# Patient Record
Sex: Female | Born: 1950 | Race: White | Hispanic: No | Marital: Single | Smoking: Never smoker
Health system: Southern US, Community
[De-identification: ages and names within clinical notes are randomized; demographics above are authoritative.]

## PROBLEM LIST (undated history)

## (undated) DIAGNOSIS — M810 Age-related osteoporosis without current pathological fracture: Secondary | ICD-10-CM

## (undated) DIAGNOSIS — L0291 Cutaneous abscess, unspecified: Secondary | ICD-10-CM

## (undated) DIAGNOSIS — H9190 Unspecified hearing loss, unspecified ear: Secondary | ICD-10-CM

## (undated) DIAGNOSIS — R920 Mammographic microcalcification found on diagnostic imaging of breast: Secondary | ICD-10-CM

## (undated) DIAGNOSIS — N6019 Diffuse cystic mastopathy of unspecified breast: Secondary | ICD-10-CM

## (undated) DIAGNOSIS — Z780 Asymptomatic menopausal state: Secondary | ICD-10-CM

## (undated) DIAGNOSIS — E785 Hyperlipidemia, unspecified: Secondary | ICD-10-CM

## (undated) DIAGNOSIS — E039 Hypothyroidism, unspecified: Secondary | ICD-10-CM

## (undated) DIAGNOSIS — Z973 Presence of spectacles and contact lenses: Secondary | ICD-10-CM

## (undated) DIAGNOSIS — T8859XA Other complications of anesthesia, initial encounter: Secondary | ICD-10-CM

## (undated) DIAGNOSIS — E079 Disorder of thyroid, unspecified: Secondary | ICD-10-CM

## (undated) DIAGNOSIS — E669 Obesity, unspecified: Secondary | ICD-10-CM

## (undated) DIAGNOSIS — Z8709 Personal history of other diseases of the respiratory system: Secondary | ICD-10-CM

## (undated) DIAGNOSIS — Z8719 Personal history of other diseases of the digestive system: Secondary | ICD-10-CM

## (undated) DIAGNOSIS — G4730 Sleep apnea, unspecified: Secondary | ICD-10-CM

## (undated) DIAGNOSIS — T4145XA Adverse effect of unspecified anesthetic, initial encounter: Secondary | ICD-10-CM

## (undated) DIAGNOSIS — Z1211 Encounter for screening for malignant neoplasm of colon: Secondary | ICD-10-CM

## (undated) DIAGNOSIS — K5790 Diverticulosis of intestine, part unspecified, without perforation or abscess without bleeding: Secondary | ICD-10-CM

## (undated) DIAGNOSIS — K635 Polyp of colon: Secondary | ICD-10-CM

## (undated) DIAGNOSIS — M1990 Unspecified osteoarthritis, unspecified site: Secondary | ICD-10-CM

## (undated) HISTORY — DX: Diffuse cystic mastopathy of unspecified breast: N60.19

## (undated) HISTORY — DX: Polyp of colon: K63.5

## (undated) HISTORY — PX: CYSTOCELE REPAIR: SHX163

## (undated) HISTORY — PX: HEEL SPUR SURGERY: SHX665

## (undated) HISTORY — PX: ORIF SHOULDER FRACTURE: SHX5035

## (undated) HISTORY — DX: Encounter for screening for malignant neoplasm of colon: Z12.11

## (undated) HISTORY — DX: Disorder of thyroid, unspecified: E07.9

## (undated) HISTORY — PX: RECTOCELE REPAIR: SHX761

## (undated) HISTORY — PX: FRACTURE SURGERY: SHX138

## (undated) HISTORY — DX: Mammographic microcalcification found on diagnostic imaging of breast: R92.0

## (undated) HISTORY — DX: Personal history of other diseases of the digestive system: Z87.19

## (undated) HISTORY — PX: PLANTAR FASCIA SURGERY: SHX746

## (undated) HISTORY — DX: Hyperlipidemia, unspecified: E78.5

## (undated) HISTORY — DX: Obesity, unspecified: E66.9

## (undated) HISTORY — PX: EYE SURGERY: SHX253

## (undated) HISTORY — PX: NOSE SURGERY: SHX723

## (undated) HISTORY — DX: Asymptomatic menopausal state: Z78.0

---

## 1994-05-24 DIAGNOSIS — Z780 Asymptomatic menopausal state: Secondary | ICD-10-CM

## 1994-05-24 HISTORY — DX: Asymptomatic menopausal state: Z78.0

## 2003-05-25 HISTORY — PX: BREAST CYST ASPIRATION: SHX578

## 2004-12-02 ENCOUNTER — Other Ambulatory Visit: Payer: Self-pay

## 2004-12-07 ENCOUNTER — Ambulatory Visit: Payer: Self-pay | Admitting: Surgery

## 2005-05-10 ENCOUNTER — Ambulatory Visit: Payer: Self-pay | Admitting: Internal Medicine

## 2007-05-25 HISTORY — PX: COLONOSCOPY: SHX174

## 2007-05-25 HISTORY — PX: BIOPSY THYROID: PRO38

## 2007-11-21 ENCOUNTER — Ambulatory Visit: Payer: Self-pay | Admitting: Otolaryngology

## 2007-12-06 ENCOUNTER — Ambulatory Visit: Payer: Self-pay | Admitting: Otolaryngology

## 2008-01-02 ENCOUNTER — Ambulatory Visit: Payer: Self-pay | Admitting: Otolaryngology

## 2008-05-09 ENCOUNTER — Ambulatory Visit: Payer: Self-pay | Admitting: Unknown Physician Specialty

## 2008-09-16 ENCOUNTER — Ambulatory Visit: Payer: Self-pay | Admitting: Unknown Physician Specialty

## 2009-05-24 DIAGNOSIS — Z8719 Personal history of other diseases of the digestive system: Secondary | ICD-10-CM

## 2009-05-24 DIAGNOSIS — R920 Mammographic microcalcification found on diagnostic imaging of breast: Secondary | ICD-10-CM

## 2009-05-24 DIAGNOSIS — E079 Disorder of thyroid, unspecified: Secondary | ICD-10-CM

## 2009-05-24 HISTORY — DX: Disorder of thyroid, unspecified: E07.9

## 2009-05-24 HISTORY — DX: Personal history of other diseases of the digestive system: Z87.19

## 2009-05-24 HISTORY — DX: Mammographic microcalcification found on diagnostic imaging of breast: R92.0

## 2009-11-05 ENCOUNTER — Emergency Department: Payer: Self-pay | Admitting: Emergency Medicine

## 2009-11-24 ENCOUNTER — Ambulatory Visit: Payer: Self-pay | Admitting: Family Medicine

## 2010-02-21 HISTORY — PX: BREAST SURGERY: SHX581

## 2010-05-24 DIAGNOSIS — E785 Hyperlipidemia, unspecified: Secondary | ICD-10-CM

## 2010-05-24 HISTORY — DX: Hyperlipidemia, unspecified: E78.5

## 2010-08-26 ENCOUNTER — Ambulatory Visit: Payer: Self-pay | Admitting: General Surgery

## 2011-02-26 ENCOUNTER — Ambulatory Visit: Payer: Self-pay | Admitting: General Surgery

## 2011-04-07 ENCOUNTER — Ambulatory Visit: Payer: Self-pay | Admitting: Orthopedic Surgery

## 2011-05-25 DIAGNOSIS — N6019 Diffuse cystic mastopathy of unspecified breast: Secondary | ICD-10-CM

## 2011-05-25 HISTORY — DX: Diffuse cystic mastopathy of unspecified breast: N60.19

## 2012-02-28 ENCOUNTER — Ambulatory Visit: Payer: Self-pay | Admitting: General Surgery

## 2012-03-03 ENCOUNTER — Other Ambulatory Visit: Payer: Self-pay | Admitting: Orthopedic Surgery

## 2012-03-06 ENCOUNTER — Encounter (HOSPITAL_BASED_OUTPATIENT_CLINIC_OR_DEPARTMENT_OTHER): Payer: Self-pay | Admitting: *Deleted

## 2012-03-06 NOTE — Progress Notes (Signed)
Denies any heart or resp problems  

## 2012-03-08 NOTE — H&P (Signed)
Holly Carroll is an 61 y.o. female.   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   HPI: Holly Carroll presents for evaluation of a number of predicaments affecting her right hand. I saw Holly Carroll for a detailed evaluation in June of 2011. At that time we noted that she likely had bilateral carpal tunnel syndrome. 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 We did not recommend proceeding with treatment of the Dupuytren's contracture at that time.   Holly Carroll now has hand numbness, discomfort at night, and chronic triggering of her right ring finger. She is developing early Dupuytren's palmar fibromatosis of her right palm pretendinous fibers of the ring finger. Her left Dupuytren's fibromatosis progressed with nodular disease in the palm to the ring finger.     Past Medical History  Diagnosis Date  . Complication of anesthesia     hard to wake up,memory loss  . Arthritis   . Wears glasses   . Sleep apnea     uses a cpap-will bring dos-will use post op    Past Surgical History  Procedure Date  . Cystocele repair   . Rectocele repair   . Heel spur surgery     both feet  . Plantar fascia surgery     right  . Nose surgery     growth on nose as child  . Orif shoulder fracture     No family history on file. Social History:  reports that she has never smoked. She does not have any smokeless tobacco history on file. She reports that she does not drink alcohol or use illicit drugs.  Allergies:  Allergies  Allergen Reactions  . Doxycycline     Losses balance  . Tape     blister    No prescriptions prior to admission    No results found for this or any previous visit (from the past 48 hour(s)).  No results found.   Pertinent items are noted in  HPI.  Height 5\' 6"  (1.676 m), weight 96.163 kg (212 lb).  General appearance: alert Head: Normocephalic, without obvious abnormality Neck: supple, symmetrical, trachea midline Resp: clear to auscultation bilaterally Cardio: RRR without murmur GI: normal findings: bowel sounds normal Extremities. She has a very stiff right ring finger. She is tender on palpation over the A-1 pulley and has marked thickening of her flexor sheath. She has early palmar fibromatosis nodules in the pretendinous fibers of the right ring finger. She has full motion of the MP joint. She can extend her PIP joint to a 5 degree flexion contracture and has further flexion to 80 degrees. She cannot close her fingers to the palm. She has full passive motion of the index, long and small fingers. Her neurovascular exam is intact.   Plain x-rays are obtained to rule out possible arthritis at the right ring finger PIP joint as a cause of her inability to close her fingers to the palm. She does have diffuse narrowing of all her IP joints but does not show a preponderance of arthritis at the right ring finger PIP joint.   Pulses: 2+ and symmetric Skin: normal Neurologic: Grossly normal    Assessment/Plan Impression:1) Chronic STS right ring finger  2) Early Dupuytren's cord right ring finger  3) right CTS  Plan: To the OR for release  A-1 pulley right ring finger with resection pretedinous fibers and possible Right CTR.The procedure, risks,benefits and post-op course were discussed with the patient at length and they were in agreement with the plan.   DASNOIT,Holly Carroll 03/08/2012, 3:05 PM     H&P documentation: 03/09/2012  -History and Physical Reviewed  -Patient has been re-examined  -No change in the plan of care  Wyn Forster, MD

## 2012-03-09 ENCOUNTER — Encounter (HOSPITAL_BASED_OUTPATIENT_CLINIC_OR_DEPARTMENT_OTHER): Payer: Self-pay | Admitting: Anesthesiology

## 2012-03-09 ENCOUNTER — Encounter (HOSPITAL_BASED_OUTPATIENT_CLINIC_OR_DEPARTMENT_OTHER): Payer: Self-pay | Admitting: *Deleted

## 2012-03-09 ENCOUNTER — Encounter (HOSPITAL_BASED_OUTPATIENT_CLINIC_OR_DEPARTMENT_OTHER)
Admission: RE | Disposition: A | Discharge status: Home / Self Care | Payer: Self-pay | Source: Ambulatory Visit | Attending: Orthopedic Surgery

## 2012-03-09 ENCOUNTER — Ambulatory Visit (HOSPITAL_BASED_OUTPATIENT_CLINIC_OR_DEPARTMENT_OTHER)
Admission: RE | Admit: 2012-03-09 | Discharge: 2012-03-09 | Disposition: A | Discharge status: Home / Self Care | Payer: BC Managed Care – PPO | Source: Ambulatory Visit | Attending: Orthopedic Surgery | Admitting: Orthopedic Surgery

## 2012-03-09 ENCOUNTER — Encounter (HOSPITAL_BASED_OUTPATIENT_CLINIC_OR_DEPARTMENT_OTHER): Payer: Self-pay | Admitting: Certified Registered"

## 2012-03-09 ENCOUNTER — Ambulatory Visit (HOSPITAL_BASED_OUTPATIENT_CLINIC_OR_DEPARTMENT_OTHER): Payer: BC Managed Care – PPO | Admitting: Anesthesiology

## 2012-03-09 DIAGNOSIS — M65839 Other synovitis and tenosynovitis, unspecified forearm: Secondary | ICD-10-CM | POA: Insufficient documentation

## 2012-03-09 DIAGNOSIS — M65849 Other synovitis and tenosynovitis, unspecified hand: Secondary | ICD-10-CM | POA: Insufficient documentation

## 2012-03-09 DIAGNOSIS — G5600 Carpal tunnel syndrome, unspecified upper limb: Secondary | ICD-10-CM | POA: Insufficient documentation

## 2012-03-09 DIAGNOSIS — M6530 Trigger finger, unspecified finger: Secondary | ICD-10-CM | POA: Insufficient documentation

## 2012-03-09 DIAGNOSIS — M720 Palmar fascial fibromatosis [Dupuytren]: Secondary | ICD-10-CM | POA: Insufficient documentation

## 2012-03-09 HISTORY — DX: Other complications of anesthesia, initial encounter: T88.59XA

## 2012-03-09 HISTORY — PX: CARPAL TUNNEL RELEASE: SHX101

## 2012-03-09 HISTORY — PX: DUPUYTREN CONTRACTURE RELEASE: SHX1478

## 2012-03-09 HISTORY — DX: Unspecified osteoarthritis, unspecified site: M19.90

## 2012-03-09 HISTORY — DX: Sleep apnea, unspecified: G47.30

## 2012-03-09 HISTORY — PX: TRIGGER FINGER RELEASE: SHX641

## 2012-03-09 HISTORY — DX: Adverse effect of unspecified anesthetic, initial encounter: T41.45XA

## 2012-03-09 HISTORY — DX: Presence of spectacles and contact lenses: Z97.3

## 2012-03-09 LAB — POCT HEMOGLOBIN-HEMACUE: Hemoglobin: 13.5 g/dL (ref 12.0–15.0)

## 2012-03-09 SURGERY — RELEASE, A1 PULLEY, FOR TRIGGER FINGER
Anesthesia: General | Site: Hand | Laterality: Right | Wound class: Clean

## 2012-03-09 MED ORDER — DEXAMETHASONE SODIUM PHOSPHATE 4 MG/ML IJ SOLN
INTRAMUSCULAR | Status: DC | PRN
  Administered 2012-03-09: 8 mg via INTRAVENOUS

## 2012-03-09 MED ORDER — PROPOFOL 10 MG/ML IV BOLUS
INTRAVENOUS | Status: DC | PRN
  Administered 2012-03-09: 50 mg via INTRAVENOUS
  Administered 2012-03-09: 150 mg via INTRAVENOUS

## 2012-03-09 MED ORDER — FENTANYL CITRATE 0.05 MG/ML IJ SOLN
50.0000 ug | INTRAMUSCULAR | Status: AC | PRN
  Administered 2012-03-09 (×2): 50 ug via INTRAVENOUS

## 2012-03-09 MED ORDER — LACTATED RINGERS IV SOLN
INTRAVENOUS | Status: DC
  Administered 2012-03-09: 08:00:00 via INTRAVENOUS

## 2012-03-09 MED ORDER — ROPIVACAINE HCL 5 MG/ML IJ SOLN
INTRAMUSCULAR | Status: DC | PRN
  Administered 2012-03-09: 150 mg via EPIDURAL

## 2012-03-09 MED ORDER — CHLORHEXIDINE GLUCONATE 4 % EX LIQD
60.0000 mL | Freq: Once | CUTANEOUS | Status: AC
  Administered 2012-03-09: 4 via TOPICAL

## 2012-03-09 MED ORDER — OXYCODONE-ACETAMINOPHEN 5-325 MG PO TABS: 1.0000 | ORAL_TABLET | ORAL | Status: DC | PRN

## 2012-03-09 MED ORDER — MIDAZOLAM HCL 2 MG/2ML IJ SOLN
1.0000 mg | INTRAMUSCULAR | Status: AC | PRN
  Administered 2012-03-09 (×2): 1 mg via INTRAVENOUS

## 2012-03-09 SURGICAL SUPPLY — 57 items
BANDAGE ADHESIVE 1X3 (GAUZE/BANDAGES/DRESSINGS) IMPLANT
BANDAGE CONFORM 3  STR LF (GAUZE/BANDAGES/DRESSINGS) IMPLANT
BANDAGE ELASTIC 3 VELCRO ST LF (GAUZE/BANDAGES/DRESSINGS) ×3 IMPLANT
BANDAGE GAUZE ELAST BULKY 4 IN (GAUZE/BANDAGES/DRESSINGS) IMPLANT
BLADE MINI RND TIP GREEN BEAV (BLADE) ×3 IMPLANT
BLADE SURG 15 STRL LF DISP TIS (BLADE) ×2 IMPLANT
BLADE SURG 15 STRL SS (BLADE) ×1
BNDG ELASTIC 2 VLCR STRL LF (GAUZE/BANDAGES/DRESSINGS) ×3 IMPLANT
BNDG ESMARK 4X9 LF (GAUZE/BANDAGES/DRESSINGS) ×3 IMPLANT
BRUSH SCRUB EZ PLAIN DRY (MISCELLANEOUS) ×3 IMPLANT
CLOTH BEACON ORANGE TIMEOUT ST (SAFETY) ×3 IMPLANT
CORDS BIPOLAR (ELECTRODE) ×3 IMPLANT
COVER MAYO STAND STRL (DRAPES) ×3 IMPLANT
COVER TABLE BACK 60X90 (DRAPES) ×3 IMPLANT
CUFF TOURNIQUET SINGLE 18IN (TOURNIQUET CUFF) ×3 IMPLANT
DECANTER SPIKE VIAL GLASS SM (MISCELLANEOUS) IMPLANT
DRAPE EXTREMITY T 121X128X90 (DRAPE) ×3 IMPLANT
DRAPE SURG 17X23 STRL (DRAPES) ×3 IMPLANT
DRSG EMULSION OIL 3X3 NADH (GAUZE/BANDAGES/DRESSINGS) IMPLANT
GAUZE SPONGE 4X4 12PLY STRL LF (GAUZE/BANDAGES/DRESSINGS) ×6 IMPLANT
GAUZE XEROFORM 1X8 LF (GAUZE/BANDAGES/DRESSINGS) ×3 IMPLANT
GLOVE BIO SURGEON STRL SZ 6.5 (GLOVE) ×3 IMPLANT
GLOVE BIOGEL M STRL SZ7.5 (GLOVE) ×3 IMPLANT
GLOVE BIOGEL PI IND STRL 7.0 (GLOVE) ×2 IMPLANT
GLOVE BIOGEL PI INDICATOR 7.0 (GLOVE) ×1
GLOVE ORTHO TXT STRL SZ7.5 (GLOVE) ×3 IMPLANT
GOWN PREVENTION PLUS XLARGE (GOWN DISPOSABLE) ×3 IMPLANT
GOWN PREVENTION PLUS XXLARGE (GOWN DISPOSABLE) ×6 IMPLANT
GOWN STRL REIN XL XLG (GOWN DISPOSABLE) ×6 IMPLANT
LOOP VESSEL MAXI BLUE (MISCELLANEOUS) ×3 IMPLANT
NEEDLE 27GAX1X1/2 (NEEDLE) ×3 IMPLANT
NEEDLE HYPO 25X1 1.5 SAFETY (NEEDLE) IMPLANT
NS IRRIG 1000ML POUR BTL (IV SOLUTION) ×3 IMPLANT
PACK BASIN DAY SURGERY FS (CUSTOM PROCEDURE TRAY) ×3 IMPLANT
PAD CAST 3X4 CTTN HI CHSV (CAST SUPPLIES) ×2 IMPLANT
PAD CAST 4YDX4 CTTN HI CHSV (CAST SUPPLIES) ×2 IMPLANT
PADDING CAST ABS 4INX4YD NS (CAST SUPPLIES) ×1
PADDING CAST ABS COTTON 4X4 ST (CAST SUPPLIES) ×2 IMPLANT
PADDING CAST COTTON 3X4 STRL (CAST SUPPLIES) ×1
PADDING CAST COTTON 4X4 STRL (CAST SUPPLIES) ×1
SPLINT PLASTER CAST XFAST 3X15 (CAST SUPPLIES) ×10 IMPLANT
SPLINT PLASTER XTRA FASTSET 3X (CAST SUPPLIES) ×5
SPONGE GAUZE 4X4 12PLY (GAUZE/BANDAGES/DRESSINGS) ×3 IMPLANT
STOCKINETTE 4X48 STRL (DRAPES) ×3 IMPLANT
STRIP CLOSURE SKIN 1/2X4 (GAUZE/BANDAGES/DRESSINGS) ×3 IMPLANT
SUT ETHILON 5 0 P 3 18 (SUTURE) ×1
SUT NYLON ETHILON 5-0 P-3 1X18 (SUTURE) ×2 IMPLANT
SUT PROLENE 3 0 PS 2 (SUTURE) ×3 IMPLANT
SUT SILK 4 0 PS 2 (SUTURE) IMPLANT
SUT VIC AB 4-0 P2 18 (SUTURE) IMPLANT
SYR 3ML 23GX1 SAFETY (SYRINGE) IMPLANT
SYR BULB 3OZ (MISCELLANEOUS) IMPLANT
SYR CONTROL 10ML LL (SYRINGE) ×3 IMPLANT
TOWEL OR 17X24 6PK STRL BLUE (TOWEL DISPOSABLE) ×6 IMPLANT
TRAY DSU PREP LF (CUSTOM PROCEDURE TRAY) ×3 IMPLANT
UNDERPAD 30X30 INCONTINENT (UNDERPADS AND DIAPERS) ×3 IMPLANT
WATER STERILE IRR 1000ML POUR (IV SOLUTION) ×3 IMPLANT

## 2012-03-09 NOTE — Progress Notes (Signed)
Assisted Dr. Singer with right, ultrasound guided, supraclavicular block. Side rails up, monitors on throughout procedure. See vital signs in flow sheet. Tolerated Procedure well. 

## 2012-03-09 NOTE — Anesthesia Procedure Notes (Addendum)
Anesthesia Regional Block:  Supraclavicular block  Pre-Anesthetic Checklist: ,, timeout performed, Correct Patient, Correct Site, Correct Laterality, Correct Procedure,, site marked, risks and benefits discussed, Surgical consent,  Pre-op evaluation,  At surgeon's request and post-op pain management  Laterality: Right  Prep: chloraprep       Needles:  Injection technique: Single-shot  Needle Type: Echogenic Stimulator Needle     Needle Length: 5cm 5 cm Needle Gauge: 22 and 22 G    Additional Needles:  Procedures: ultrasound guided and nerve stimulator Supraclavicular block  Nerve Stimulator or Paresthesia:  Response: bicep contraction, 0.45 mA,   Additional Responses:   Narrative:  Start time: 03/09/2012 8:33 AM End time: 03/09/2012 8:46 AM Injection made incrementally with aspirations every 5 mL.  Performed by: Personally  Anesthesiologist: J. Adonis Huguenin, MD  Additional Notes: Functioning IV was confirmed and monitors applied.  A 50mm 22ga echogenic arrow stimulator was used. Sterile prep and drape,hand hygiene and sterile gloves were used.Ultrasound guidance: relevant anatomy identified, needle position confirmed, local anesthetic spread visualized around nerve(s)., vascular puncture avoided.  Image printed for medical record.  Negative aspiration and negative test dose prior to incremental administration of local anesthetic. The patient tolerated the procedure well.  Interscalene brachial plexus block Procedure Name: LMA Insertion Date/Time: 03/09/2012 9:44 AM Performed by: Verlan Friends Pre-anesthesia Checklist: Patient identified, Emergency Drugs available, Suction available, Patient being monitored and Timeout performed Patient Re-evaluated:Patient Re-evaluated prior to inductionOxygen Delivery Method: Circle System Utilized Preoxygenation: Pre-oxygenation with 100% oxygen Intubation Type: IV induction Ventilation: Mask ventilation without difficulty LMA:  LMA inserted LMA Size: 4.0 Number of attempts: 1 Airway Equipment and Method: bite block Placement Confirmation: positive ETCO2 Tube secured with: Tape (paper tape used) Dental Injury: Teeth and Oropharynx as per pre-operative assessment

## 2012-03-09 NOTE — Transfer of Care (Signed)
Immediate Anesthesia Transfer of Care Note  Patient: Holly Carroll  Procedure(s) Performed: Procedure(s) (LRB) with comments: RELEASE TRIGGER FINGER/A-1 PULLEY (Right) - a-1 pulley release right ring  DUPUYTREN CONTRACTURE RELEASE (Right) CARPAL TUNNEL RELEASE (Right)  Patient Location: PACU  Anesthesia Type: GA combined with regional for post-op pain  Level of Consciousness: sedated and patient cooperative  Airway & Oxygen Therapy: Patient Spontanous Breathing and Patient connected to face mask oxygen  Post-op Assessment: Report given to PACU RN and Post -op Vital signs reviewed and stable  Post vital signs: Reviewed and stable  Complications: No apparent anesthesia complications

## 2012-03-09 NOTE — Brief Op Note (Signed)
03/09/2012  10:12 AM  PATIENT:  Holly Carroll  61 y.o. female  PRE-OPERATIVE DIAGNOSIS:  trigger finger right ring, carpal tunnel syndrome and dupuytrens right   POST-OPERATIVE DIAGNOSIS:  trigger finger right ring, carpal tunnel syndrome and dupuytrens right  PROCEDURE:  Procedure(s) (LRB) with comments: RELEASE TRIGGER FINGER/A-1 PULLEY (Right) - a-1 pulley release right ring  DUPUYTREN CONTRACTURE RELEASE (Right) CARPAL TUNNEL RELEASE (Right)  SURGEON:  Surgeon(s) and Role:    * Wyn Forster., MD - Primary  PHYSICIAN ASSISTANT:   ASSISTANTS:Dakari Cregger Dasnoit,P.A-C   ANESTHESIA:   general  EBL:  Total I/O In: 400 [I.V.:400] Out: -   BLOOD ADMINISTERED:none  DRAINS: none   LOCAL MEDICATIONS USED:ropivacaine  SPECIMEN:  No Specimen  DISPOSITION OF SPECIMEN:  N/A  COUNTS:  YES  TOURNIQUET:   Total Tourniquet Time Documented: Upper Arm (Right) - 19 minutes  DICTATION: .Other Dictation: Dictation Number [id number]  PLAN OF CARE: Discharge to home after PACU  PATIENT DISPOSITION:  PACU - hemodynamically stable.

## 2012-03-09 NOTE — Op Note (Signed)
[id number] 

## 2012-03-09 NOTE — Anesthesia Preprocedure Evaluation (Signed)
Anesthesia Evaluation  Patient identified by MRN, date of birth, ID band Patient awake    Reviewed: Allergy & Precautions, H&P , NPO status , Patient's Chart, lab work & pertinent test results  History of Anesthesia Complications (+) PROLONGED EMERGENCE  Airway Mallampati: II TM Distance: >3 FB Neck ROM: Full    Dental  (+) Teeth Intact, Caps and Dental Advisory Given   Pulmonary sleep apnea ,    Pulmonary exam normal       Cardiovascular     Neuro/Psych negative neurological ROS     GI/Hepatic negative GI ROS, Neg liver ROS,   Endo/Other  Morbid obesity  Renal/GU negative Renal ROS     Musculoskeletal   Abdominal   Peds  Hematology   Anesthesia Other Findings   Reproductive/Obstetrics                           Anesthesia Physical Anesthesia Plan  ASA: III  Anesthesia Plan: General   Post-op Pain Management:    Induction: Intravenous  Airway Management Planned: LMA  Additional Equipment:   Intra-op Plan:   Post-operative Plan: Extubation in OR  Informed Consent: I have reviewed the patients History and Physical, chart, labs and discussed the procedure including the risks, benefits and alternatives for the proposed anesthesia with the patient or authorized representative who has indicated his/her understanding and acceptance.   Dental advisory given  Plan Discussed with: CRNA, Anesthesiologist and Surgeon  Anesthesia Plan Comments:         Anesthesia Quick Evaluation

## 2012-03-09 NOTE — Anesthesia Postprocedure Evaluation (Signed)
Anesthesia Post Note  Patient: Holly Carroll  Procedure(s) Performed: Procedure(s) (LRB): RELEASE TRIGGER FINGER/A-1 PULLEY (Right) DUPUYTREN CONTRACTURE RELEASE (Right) CARPAL TUNNEL RELEASE (Right)  Anesthesia type: general  Patient location: PACU  Post pain: Pain level controlled  Post assessment: Patient's Cardiovascular Status Stable  Last Vitals:  Filed Vitals:   03/09/12 1135  BP: 122/70  Pulse: 64  Temp: 36.7 C  Resp: 14    Post vital signs: Reviewed and stable  Level of consciousness: sedated  Complications: No apparent anesthesia complications

## 2012-03-10 ENCOUNTER — Encounter (HOSPITAL_BASED_OUTPATIENT_CLINIC_OR_DEPARTMENT_OTHER): Payer: Self-pay | Admitting: Orthopedic Surgery

## 2012-03-10 NOTE — Op Note (Signed)
[name], Carroll                ACCOUNT NO.:  [id number]  MEDICAL RECORD NO.:  [id number]  LOCATION:                                 FACILITY:  PHYSICIAN:  Katy Fitch. Keyanah Kozicki, M.D. 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  OPERATION: 1. Release of right ring finger A1 pulley. 2. Resection of Dupuytren's palmar fibromatosis, pretendinous fibers,     right ring finger and palm. 3. Through separate incision, release of transverse carpal ligament to     decompress the median nerve.  OPERATING SURGEON:  Katy Fitch. Lanaiya Lantry, MD  ASSISTANT:  Marveen Reeks Dasnoit, PA-C  ANESTHESIA:  General by LMA.  SUPERVISING ANESTHESIOLOGIST:  Quita Skye. Krista Blue, M.D.  INDICATIONS:  Holly Carroll is a 61 year old woman employed by [employer] in [place], [place].  She has a history of multiple right hand symptoms including triggering of her right ring finger and stiffness of her right ring finger PIP joint, early Dupuytren's palmar fibromatosis and chronic hand numbness.  She was evaluated at [facility] with electrodiagnostic studies performed in December 2012, which revealed  right carpal tunnel syndrome.  Due to a failure to respond to nonoperative measures, she returned for hand surgery consult requesting correction of her trigger finger, Dupuytren's and carpal tunnel release.  After informed consent, she is brought to the operating room at this time.  PROCEDURE:  Candence Sease was brought to room #1 of the [facility] and placed supine position on the operating table.  Following the induction of general anesthesia by LMA technique, the right hand and arm were prepped with Betadine soap and solution, and sterilely draped.  Preoperatively, Dr. Krista Blue placed a plexus block for perioperative comfort with ropivacaine.  Excellent anesthesia of the right arm and forequarter was achieved.  Following routine Betadine scrub and paint to the right upper extremity, a pneumatic tourniquet was applied to the proximal right brachium. Following exsanguination of right arm with an Esmarch bandage, the arterial tourniquet was inflated to 220 mmHg.  Procedure commenced with a routine surgical time-out.  Brunner zigzag incisions were fashioned to expose the pretendinous fibers to the right ring finger in the palm and the A1 pulley of the right ring finger.  The skin incisions were taken sharply followed by use of a scalpel and scissors to identify the palmar fascia.  The entire pretendinous fiber bundle of the ring finger was removed as well as the expansion of the palmar fascia to the long and small fingers.  The A1 pulley  was isolated and noted to have some myxoid degenerative change.  The myxoid changes were cleared with a rongeur followed by release of the A1 pulley.  A synovectomy of the superficialis and profundus tendons of the ring finger was accomplished followed by identification of full passive range of motion of the finger.  The Bruner incision was then repaired with intradermal 3-0 Prolene suture.  Attention was then directed to the proximal palm.   A short incision was fashioned in line of the ring finger followed by careful division of subcutaneous tissues.  The palmar fascia was identified and split longitudinally.  The distal margin of the transverse carpal ligament was identified followed by sounding of the carpal canal with a Insurance risk surveyor.  The common sensory branch of the median nerve were followed back to the median nerve proper.  This was isolated from the transverse carpal ligament with the aid of a Insurance risk surveyor.  The transverse carpal ligament was then released with scissors extending into the distal forearm, widely opened the carpal canal.  The ulnar bursa was quite fibrotic.  The median nerve was invested in fibrotic tenosynovium.  No masses or other predicaments were noted.  Bleeding points along the margin of the released ligament were electrocauterized with bipolar current followed by repair of the skin with intradermal 3-0 Prolene suture.  A compressive dressing was applied with a volar plaster splint maintaining the wrist in 15 degrees of dorsiflexion.  For aftercare, Ms. Lyday is provided prescription for Percocet 5 mg 1 or 2 tablets p.o. q.4-6 hours p.r.n. pain, 20 tablets without refill.     Katy Fitch Neshawn Aird, M.D.     RVS/MEDQ  D:  03/09/2012  T:  03/10/2012  Job:  [id number]

## 2012-10-30 ENCOUNTER — Encounter: Payer: Self-pay | Admitting: *Deleted

## 2013-02-18 IMAGING — CR PELVIS - 1-2 VIEW
1 series · 1 of 1 positions shown · non-contrast
Comparison: none

REASON FOR EXAM: low back pain left hip pain
COMMENTS:

[view not recorded]
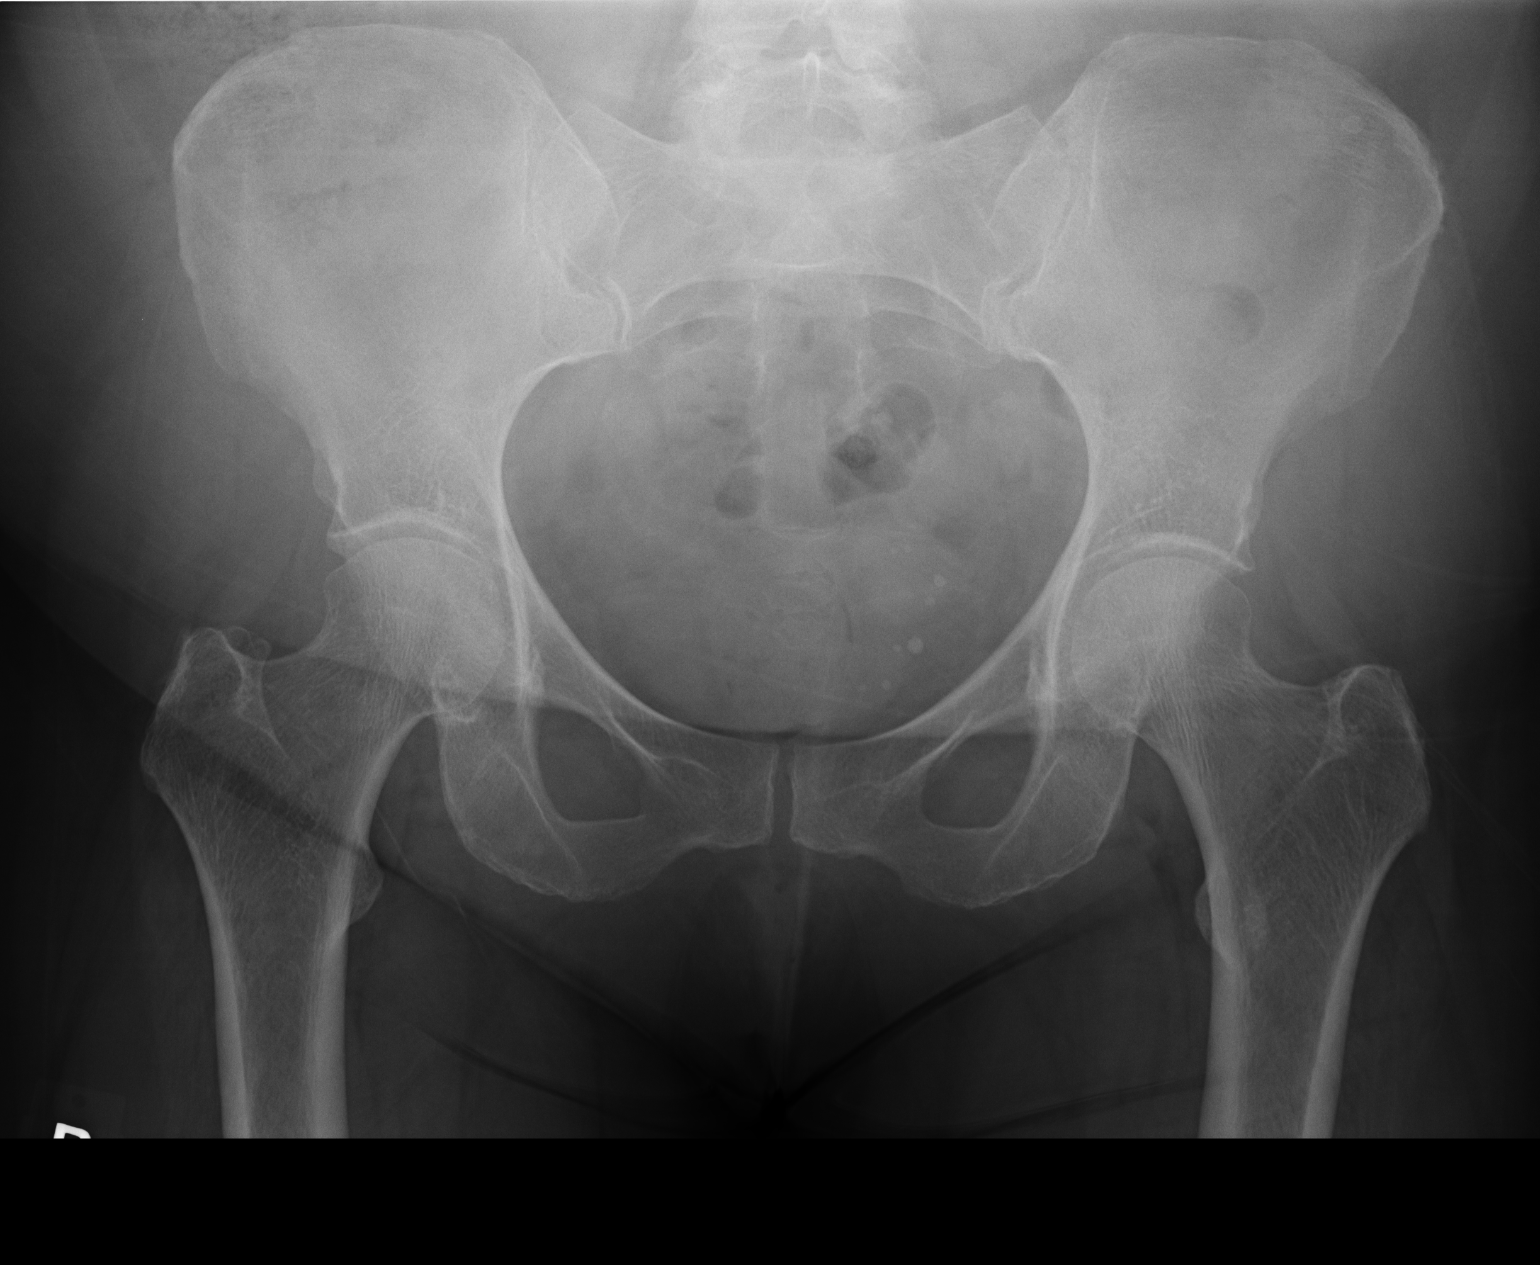

[1 of 1 positions shown; findings below may reference images not displayed]

PROCEDURE:     KDR - KDXR PELVIS AP ONLY  - April 07, 2011  [DATE]

RESULT:     An AP view of the bony pelvis shows no fracture or other acute
bony abnormality. The hip joint spaces are well-maintained and are
symmetrical. No lytic or blastic lesions are seen. The sacroiliac joints are
normal in appearance.
IMPRESSION: 1.     No significant abnormalities are noted.

## 2013-02-18 IMAGING — CR DG LUMBAR SPINE 2-3V
1 series · 3 of 3 positions shown · non-contrast
Comparison: none

REASON FOR EXAM: low back pain left hip pain
COMMENTS:

PROCEDURE:     KDR - KDXR LUMBAR SPINE AP AND LATERAL  - April 07, 2011  [DATE]
RESULT:     The vertebral body heights and the intervertebral disc spaces
are well maintained. The vertebral body alignment is normal. The pedicles
are bilaterally intact. No lytic or blastic lesions are seen.

[Series 1: view not recorded · 0.17mm/px · 3 of 3 slices shown]
[im 1/3]
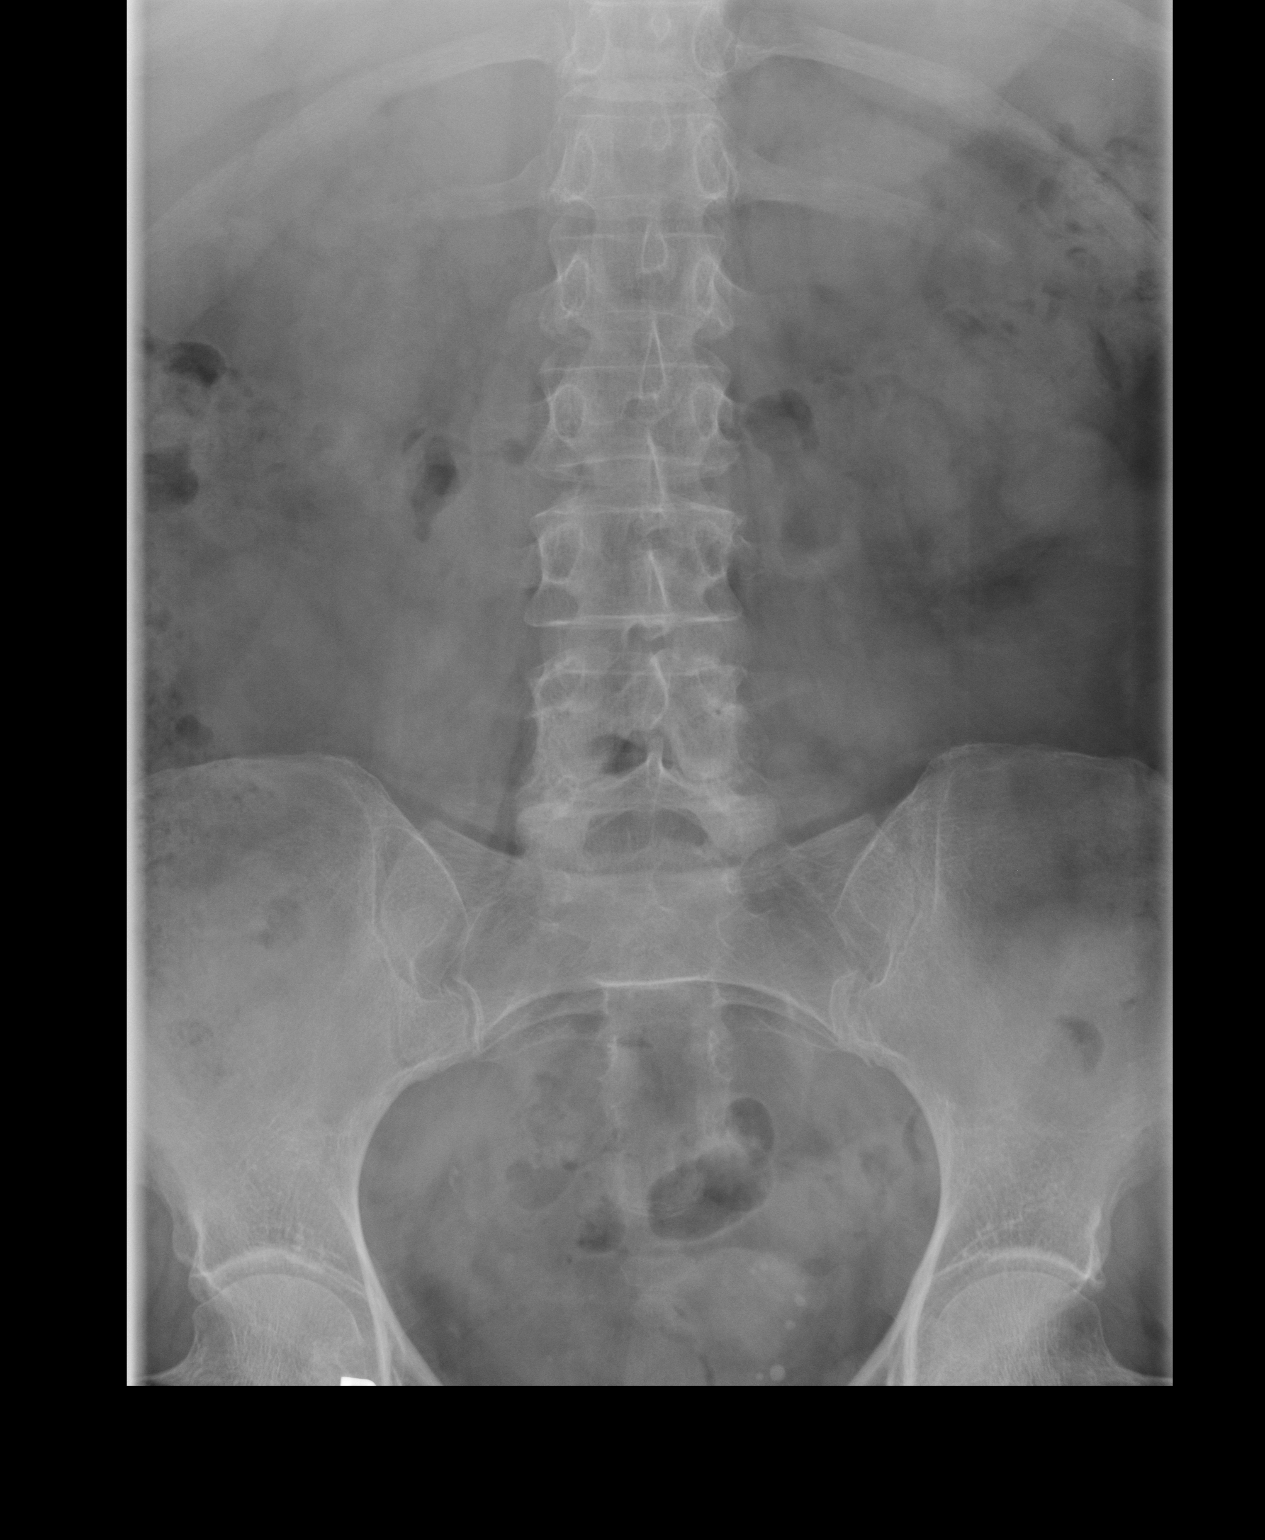
[im 2/3]
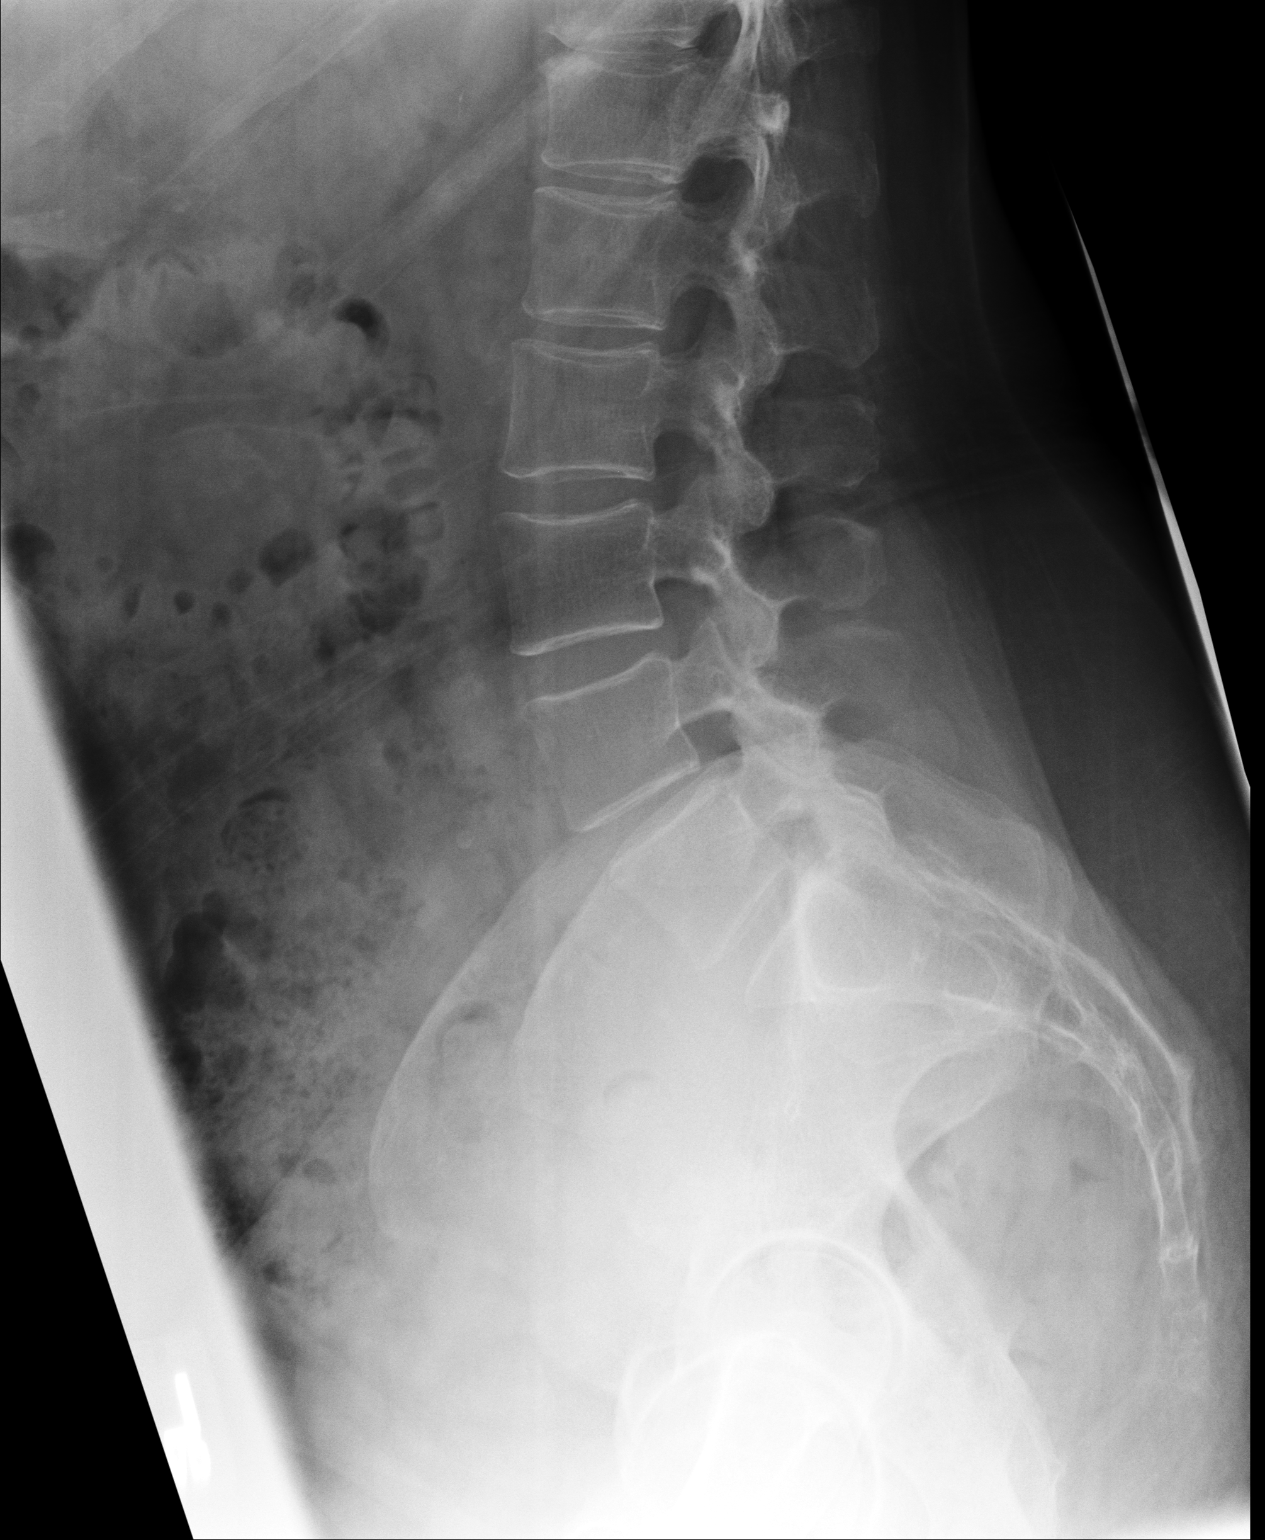
[im 3/3]
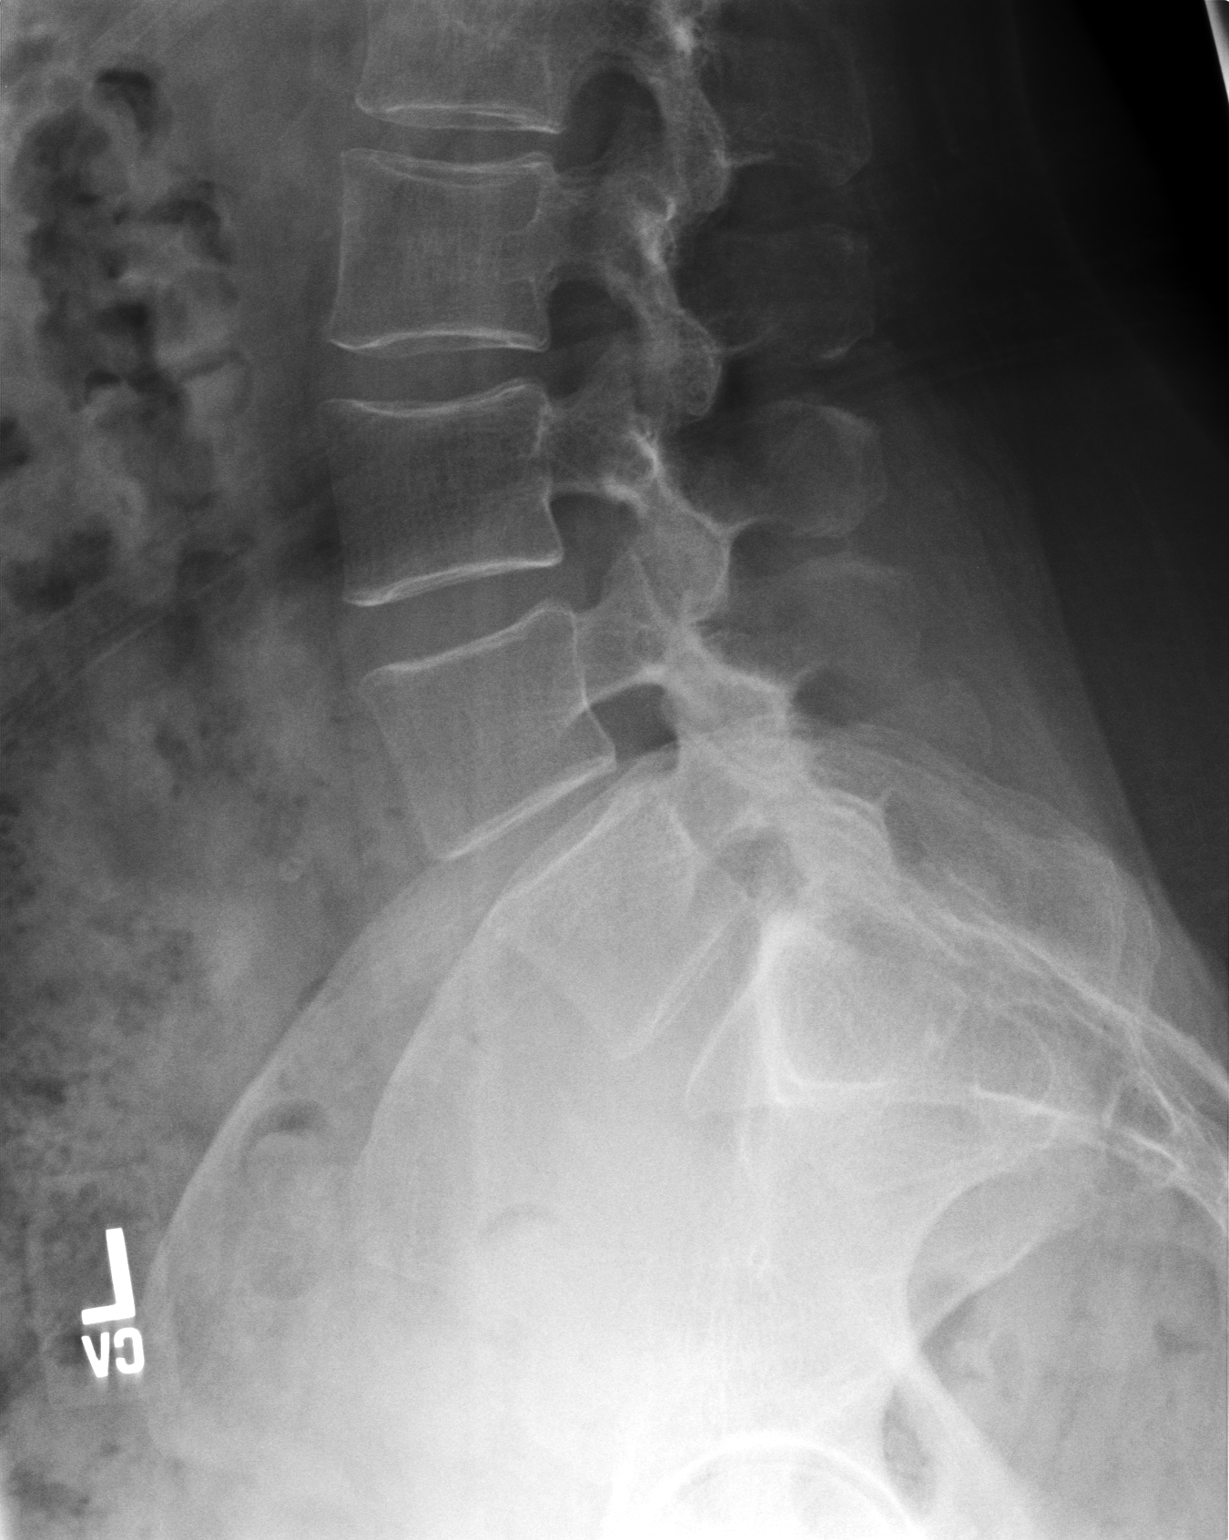

[3 of 3 positions shown; findings below may reference images not displayed]

IMPRESSION: 1.     No significant abnormalities are noted.

## 2013-03-06 ENCOUNTER — Ambulatory Visit: Payer: Self-pay

## 2013-03-07 ENCOUNTER — Ambulatory Visit: Payer: Self-pay | Admitting: General Surgery

## 2013-03-07 ENCOUNTER — Encounter: Payer: Self-pay | Admitting: General Surgery

## 2013-03-13 ENCOUNTER — Ambulatory Visit: Payer: Self-pay | Admitting: General Surgery

## 2013-03-15 ENCOUNTER — Ambulatory Visit (INDEPENDENT_AMBULATORY_CARE_PROVIDER_SITE_OTHER): Payer: BC Managed Care – PPO | Admitting: General Surgery

## 2013-03-15 ENCOUNTER — Ambulatory Visit: Payer: Self-pay

## 2013-03-15 ENCOUNTER — Encounter: Payer: Self-pay | Admitting: General Surgery

## 2013-03-15 VITALS — BP 120/70 | HR 80 | Resp 12 | Ht 66.0 in | Wt 209.0 lb

## 2013-03-15 DIAGNOSIS — Z1239 Encounter for other screening for malignant neoplasm of breast: Secondary | ICD-10-CM

## 2013-03-15 DIAGNOSIS — N6019 Diffuse cystic mastopathy of unspecified breast: Secondary | ICD-10-CM

## 2013-03-15 NOTE — Patient Instructions (Signed)
Patient to continue to do monthly self breast checks.

## 2013-03-15 NOTE — Progress Notes (Signed)
Patient ID: Holly Carroll, female   DOB: 01-13-1951, 62 y.o.   MRN: [id number]  Chief Complaint  Patient presents with  . Follow-up    mammogram    HPI Holly Carroll is a 62 y.o. female who presents for a breast evaluation. The most recent mammogram was done on 02/28/13. Patient does perform regular self breast checks and gets regular mammograms done.    HPI   Past Medical History  Diagnosis Date  . Complication of anesthesia     hard to wake up,memory loss  . Arthritis   . Wears glasses   . Sleep apnea     uses a cpap-will bring dos-will use post op  . Menopause 1996  . Diffuse cystic mastopathy 2013  . Thyroid disease 2011  . Mammographic microcalcification 2011  . Special screening for malignant neoplasms, colon   . Hyperlipidemia 2012  . Obesity, unspecified   . History of IBS 2011    Past Surgical History  Procedure Laterality Date  . Cystocele repair    . Rectocele repair    . Heel spur surgery      both feet  . Plantar fascia surgery      right  . Nose surgery      growth on nose as child  . Orif shoulder fracture    . Trigger finger release  03/09/2012    Procedure: RELEASE TRIGGER FINGER/A-1 PULLEY;  Surgeon: Wyn Forster., MD;  Location: [facility];  Service: Orthopedics;  Laterality: Right;  a-1 pulley release right ring   . Dupuytren contracture release  03/09/2012    Procedure: DUPUYTREN CONTRACTURE RELEASE;  Surgeon: Wyn Forster., MD;  Location: [facility];  Service: Orthopedics;  Laterality: Right;  . Carpal tunnel release  03/09/2012    Procedure: CARPAL TUNNEL RELEASE;  Surgeon: Wyn Forster., MD;  Location: [facility];  Service: Orthopedics;  Laterality: Right;  . Breast surgery Right October 2011    stereo bx  . Colonoscopy  2009    Dr. Mechele Collin  . Breast cyst aspiration Right 2005  . Biopsy thyroid  2009    nodule    Family History  Problem Relation Age of Onset  . Breast cancer  Maternal Grandmother   . Cervical cancer Mother     Social History History  Substance Use Topics  . Smoking status: Never Smoker   . Smokeless tobacco: Never Used  . Alcohol Use: No    Allergies  Allergen Reactions  . Doxycycline     Losses balance  . Tape     blister    Current Outpatient Prescriptions  Medication Sig Dispense Refill  . mupirocin ointment (BACTROBAN) 2 %        No current facility-administered medications for this visit.    Review of Systems Review of Systems  Constitutional: Negative.   Respiratory: Negative.   Cardiovascular: Negative.     Blood pressure 120/70, pulse 80, resp. rate 12, height 5\' 6"  (1.676 m), weight 209 lb (94.802 kg).  Physical Exam Physical Exam  Constitutional: She is oriented to person, place, and time. She appears well-developed and well-nourished.  Eyes: Conjunctivae are normal. No scleral icterus.  Neck: No mass and no thyromegaly present.  Cardiovascular: Normal rate, regular rhythm and normal heart sounds.   Pulmonary/Chest: Breath sounds normal. Right breast exhibits no inverted nipple, no mass, no nipple discharge, no skin change and no tenderness. Left breast exhibits no inverted  nipple, no mass, no nipple discharge, no skin change and no tenderness.  Abdominal: Soft. There is no tenderness.  Lymphadenopathy:    She has no cervical adenopathy.    She has no axillary adenopathy.  Neurological: She is alert and oriented to person, place, and time.  Skin: Skin is warm and dry.    Data Reviewed Mammogram reviewed  Assessment    Stable exam     Plan    Patient to return in one year bilateral screening mammogram.        SANKAR,SEEPLAPUTHUR G 03/15/2013, 11:51 AM

## 2013-09-12 DIAGNOSIS — K589 Irritable bowel syndrome without diarrhea: Secondary | ICD-10-CM | POA: Insufficient documentation

## 2013-11-29 ENCOUNTER — Ambulatory Visit: Payer: Self-pay | Admitting: Nurse Practitioner

## 2014-03-18 ENCOUNTER — Encounter: Payer: Self-pay | Admitting: General Surgery

## 2014-03-21 ENCOUNTER — Ambulatory Visit (INDEPENDENT_AMBULATORY_CARE_PROVIDER_SITE_OTHER): Payer: BC Managed Care – PPO | Admitting: General Surgery

## 2014-03-21 ENCOUNTER — Encounter: Payer: Self-pay | Admitting: General Surgery

## 2014-03-21 VITALS — BP 116/80 | HR 76 | Resp 14 | Ht 66.0 in | Wt 212.0 lb

## 2014-03-21 DIAGNOSIS — N6019 Diffuse cystic mastopathy of unspecified breast: Secondary | ICD-10-CM

## 2014-03-21 DIAGNOSIS — Z1239 Encounter for other screening for malignant neoplasm of breast: Secondary | ICD-10-CM

## 2014-03-21 NOTE — Progress Notes (Signed)
Patient ID: Holly Carroll, female   DOB: 24-Oct-1950, 63 y.o.   MRN: [id number]  Chief Complaint  Patient presents with  . Follow-up    1 year mammogram    HPI Holly Carroll is a 63 y.o. female who presents for a breast evaluation. The most recent mammogram was done on 03/11/14. Patient does perform regular self breast checks and gets regular mammograms done. The patient denies any problems with the breasts at this time.    HPI  Past Medical History  Diagnosis Date  . Complication of anesthesia     hard to wake up,memory loss  . Arthritis   . Wears glasses   . Sleep apnea     uses a cpap-will bring dos-will use post op  . Menopause 1996  . Diffuse cystic mastopathy 2013  . Thyroid disease 2011  . Mammographic microcalcification 2011  . Special screening for malignant neoplasms, colon   . Hyperlipidemia 2012  . Obesity, unspecified   . History of IBS 2011  . Colon polyps     Past Surgical History  Procedure Laterality Date  . Cystocele repair    . Rectocele repair    . Heel spur surgery      both feet  . Plantar fascia surgery      right  . Nose surgery      growth on nose as child  . Orif shoulder fracture    . Trigger finger release  03/09/2012    Procedure: RELEASE TRIGGER FINGER/A-1 PULLEY;  Surgeon: Cammie Sickle., MD;  Location: [place];  Service: Orthopedics;  Laterality: Right;  a-1 pulley release right ring   . Dupuytren contracture release  03/09/2012    Procedure: DUPUYTREN CONTRACTURE RELEASE;  Surgeon: Cammie Sickle., MD;  Location: [place];  Service: Orthopedics;  Laterality: Right;  . Carpal tunnel release  03/09/2012    Procedure: CARPAL TUNNEL RELEASE;  Surgeon: Cammie Sickle., MD;  Location: [place];  Service: Orthopedics;  Laterality: Right;  . Breast surgery Right October 2011    stereo bx  . Colonoscopy  2009    Dr. Vira Agar  . Breast cyst aspiration Right 2005  . Biopsy  thyroid  2009    nodule    Family History  Problem Relation Age of Onset  . Breast cancer Maternal Grandmother   . Cervical cancer Mother     Social History History  Substance Use Topics  . Smoking status: Never Smoker   . Smokeless tobacco: Never Used  . Alcohol Use: No    Allergies  Allergen Reactions  . Doxycycline     Losses balance  . Tape     blister    Current Outpatient Prescriptions  Medication Sig Dispense Refill  . lovastatin (MEVACOR) 20 MG tablet Take 20 mg by mouth at bedtime.      Marland Kitchen SYNTHROID 50 MCG tablet Take 50 mcg by mouth daily.        No current facility-administered medications for this visit.    Review of Systems Review of Systems  Constitutional: Negative.   Respiratory: Negative.   Cardiovascular: Negative.     Blood pressure 116/80, pulse 76, resp. rate 14, height 5\' 6"  (1.676 m), weight 212 lb (96.163 kg).  Physical Exam Physical Exam  Constitutional: She is oriented to person, place, and time. She appears well-developed and well-nourished.  Eyes: Conjunctivae are normal. No scleral icterus.  Neck: Neck supple. No thyromegaly  present.  Cardiovascular: Normal rate, regular rhythm and normal heart sounds.   No murmur heard. Pulmonary/Chest: Effort normal and breath sounds normal. Right breast exhibits no inverted nipple, no mass, no nipple discharge, no skin change and no tenderness. Left breast exhibits no inverted nipple, no mass, no nipple discharge, no skin change and no tenderness.  Lymphadenopathy:    She has no cervical adenopathy.    She has no axillary adenopathy.  Neurological: She is alert and oriented to person, place, and time.  Skin: Skin is warm and dry.    Data Reviewed Mammogram reviewed and stable.   Assessment    Stable exam.     Plan    Patient to return in 1 year bilateral screening mammogram.        SANKAR,SEEPLAPUTHUR G 03/21/2014, 5:12 PM

## 2014-03-21 NOTE — Patient Instructions (Signed)
Patient to return in 1 year bilateral screening mammogram. Continue self breast exams. Call office for any new breast issues or concerns.

## 2014-03-25 ENCOUNTER — Encounter: Payer: Self-pay | Admitting: General Surgery

## 2014-08-04 ENCOUNTER — Ambulatory Visit: Payer: Self-pay | Admitting: Family Medicine

## 2015-03-25 ENCOUNTER — Encounter: Payer: Self-pay | Admitting: General Surgery

## 2015-03-25 ENCOUNTER — Ambulatory Visit (INDEPENDENT_AMBULATORY_CARE_PROVIDER_SITE_OTHER): Payer: BLUE CROSS/BLUE SHIELD | Admitting: General Surgery

## 2015-03-25 VITALS — BP 128/78 | HR 74 | Resp 12 | Ht 65.0 in | Wt 217.0 lb

## 2015-03-25 DIAGNOSIS — Z803 Family history of malignant neoplasm of breast: Secondary | ICD-10-CM | POA: Diagnosis not present

## 2015-03-25 DIAGNOSIS — N6019 Diffuse cystic mastopathy of unspecified breast: Secondary | ICD-10-CM | POA: Diagnosis not present

## 2015-03-25 DIAGNOSIS — Z86010 Personal history of colonic polyps: Secondary | ICD-10-CM | POA: Insufficient documentation

## 2015-03-25 MED ORDER — POLYETHYLENE GLYCOL 3350 17 GM/SCOOP PO POWD: 1.0000 | Freq: Once | ORAL | Status: DC

## 2015-03-25 NOTE — Progress Notes (Signed)
Patient ID: Holly Carroll, female   DOB: November 02, 1950, 64 y.o.   MRN: [id number]  Chief Complaint  Patient presents with  . Follow-up    mammogram    HPI Holly Carroll is a 64 y.o. female.  who presents for a breast evaluation. The most recent mammogram was done on 03-17-15 .  Patient does perform regular self breast checks and gets regular mammograms done.   I have reviewed the history of present illness with the patient.  HPI  Past Medical History  Diagnosis Date  . Complication of anesthesia     hard to wake up,memory loss  . Arthritis   . Wears glasses   . Sleep apnea     uses a cpap-will bring dos-will use post op  . Menopause 1996  . Diffuse cystic mastopathy 2013  . Thyroid disease 2011  . Mammographic microcalcification 2011  . Special screening for malignant neoplasms, colon   . Hyperlipidemia 2012  . Obesity, unspecified   . History of IBS 2011  . Colon polyps     Past Surgical History  Procedure Laterality Date  . Cystocele repair    . Rectocele repair    . Heel spur surgery      both feet  . Plantar fascia surgery      right  . Nose surgery      growth on nose as child  . Orif shoulder fracture    . Trigger finger release  03/09/2012    Procedure: RELEASE TRIGGER FINGER/A-1 PULLEY;  Surgeon: Cammie Sickle., MD;  Location: [place];  Service: Orthopedics;  Laterality: Right;  a-1 pulley release right ring   . Dupuytren contracture release  03/09/2012    Procedure: DUPUYTREN CONTRACTURE RELEASE;  Surgeon: Cammie Sickle., MD;  Location: [place];  Service: Orthopedics;  Laterality: Right;  . Carpal tunnel release  03/09/2012    Procedure: CARPAL TUNNEL RELEASE;  Surgeon: Cammie Sickle., MD;  Location: [place];  Service: Orthopedics;  Laterality: Right;  . Breast surgery Right October 2011    stereo bx  . Colonoscopy  2009    Dr. Vira Agar  . Breast cyst aspiration Right 2005  . Biopsy  thyroid  2009    nodule    Family History  Problem Relation Age of Onset  . Breast cancer Maternal Grandmother   . Cervical cancer Mother     Social History Social History  Substance Use Topics  . Smoking status: Never Smoker   . Smokeless tobacco: Never Used  . Alcohol Use: No    Allergies  Allergen Reactions  . Doxycycline     Losses balance  . Tape     blister    Current Outpatient Prescriptions  Medication Sig Dispense Refill  . lovastatin (MEVACOR) 40 MG tablet Take by mouth.    . SYNTHROID 50 MCG tablet Take 50 mcg by mouth daily.     . polyethylene glycol powder (GLYCOLAX/MIRALAX) powder Take 255 g by mouth once. 255 g 0   No current facility-administered medications for this visit.    Review of Systems Review of Systems  Blood pressure 128/78, pulse 74, resp. rate 12, height 5\' 5"  (1.651 m), weight 217 lb (98.431 kg).  Physical Exam Physical Exam  Constitutional: She is oriented to person, place, and time. She appears well-developed and well-nourished.  Eyes: Conjunctivae are normal. No scleral icterus.  Neck: Neck supple.  Cardiovascular: Normal rate, regular rhythm  and normal heart sounds.   Pulmonary/Chest: Effort normal and breath sounds normal. Right breast exhibits no inverted nipple, no mass, no nipple discharge, no skin change and no tenderness. Left breast exhibits no inverted nipple, no mass, no nipple discharge, no skin change and no tenderness.  Abdominal: Soft. Bowel sounds are normal. There is no tenderness.  Lymphadenopathy:    She has no cervical adenopathy.    She has no axillary adenopathy.  Neurological: She is alert and oriented to person, place, and time.  Skin: Skin is warm and dry.    Data Reviewed Mammogram reviewed and stable.  Assessment    Stable exam. History of FCD. PH of colon polyp-colonoscopy due. Remote FH of breast cancer    Plan    The patient has been asked to return to the office in one year with a bilateral  screening mammogram.    Colonoscopy with possible biopsy/polypectomy prn: Information regarding the procedure, including its potential risks and complications (including but not limited to perforation of the bowel, which may require emergency surgery to repair, and bleeding) was verbally given to the patient. Educational information regarding lower intestinal endoscopy was given to the patient. Written instructions for how to complete the bowel prep using Miralax were provided. The importance of drinking ample fluids to avoid dehydration as a result of the prep emphasized.  Patient is scheduled for a Colonoscopy at [facility] on 05/13/15. She is aware to bring her C-Pap machine with her. Miralax prescription has been sent into her pharmacy. Patient is aware of date and instructions.   PCP:  Joselyn Glassman 03/25/2015, 10:58 AM

## 2015-03-25 NOTE — Patient Instructions (Addendum)
The patient is aware to call back for any questions or concerns. Colonoscopy A colonoscopy is an exam to look at the entire large intestine (colon). This exam can help find problems such as tumors, polyps, inflammation, and areas of bleeding. The exam takes about 1 hour.  LET [facility] CARE PROVIDER KNOW ABOUT:   Any allergies you have.  All medicines you are taking, including vitamins, herbs, eye drops, creams, and over-the-counter medicines.  Previous problems you or members of your family have had with the use of anesthetics.  Any blood disorders you have.  Previous surgeries you have had.  Medical conditions you have. RISKS AND COMPLICATIONS  Generally, this is a safe procedure. However, as with any procedure, complications can occur. Possible complications include:  Bleeding.  Tearing or rupture of the colon wall.  Reaction to medicines given during the exam.  Infection (rare). BEFORE THE PROCEDURE   Ask your health care provider about changing or stopping your regular medicines.  You may be prescribed an oral bowel prep. This involves drinking a large amount of medicated liquid, starting the day before your procedure. The liquid will cause you to have multiple loose stools until your stool is almost clear or light green. This cleans out your colon in preparation for the procedure.  Do not eat or drink anything else once you have started the bowel prep, unless your health care provider tells you it is safe to do so.  Arrange for someone to drive you home after the procedure. PROCEDURE   You will be given medicine to help you relax (sedative).  You will lie on your side with your knees bent.  A long, flexible tube with a light and camera on the end (colonoscope) will be inserted through the rectum and into the colon. The camera sends video back to a computer screen as it moves through the colon. The colonoscope also releases carbon dioxide gas to inflate the colon. This  helps your health care provider see the area better.  During the exam, your health care provider may take a small tissue sample (biopsy) to be examined under a microscope if any abnormalities are found.  The exam is finished when the entire colon has been viewed. AFTER THE PROCEDURE   Do not drive for 24 hours after the exam.  You may have a small amount of blood in your stool.  You may pass moderate amounts of gas and have mild abdominal cramping or bloating. This is caused by the gas used to inflate your colon during the exam.  Ask when your test results will be ready and how you will get your results. Make sure you get your test results.   This information is not intended to replace advice given to you by your health care provider. Make sure you discuss any questions you have with your health care provider.   Document Released: 05/07/2000 Document Revised: 02/28/2013 Document Reviewed: 01/15/2013 Elsevier Interactive Patient Education [employer].  Patient is scheduled for a Colonoscopy at [facility] on 05/13/15. She is aware to bring her C-Pap machine with her. Miralax prescription has been sent into her pharmacy. Patient is aware of date and instructions.

## 2015-05-05 ENCOUNTER — Other Ambulatory Visit: Payer: Self-pay | Admitting: General Surgery

## 2015-05-13 ENCOUNTER — Ambulatory Visit: Payer: BLUE CROSS/BLUE SHIELD | Admitting: *Deleted

## 2015-05-13 ENCOUNTER — Encounter: Payer: Self-pay | Admitting: Anesthesiology

## 2015-05-13 ENCOUNTER — Ambulatory Visit
Admission: RE | Admit: 2015-05-13 | Discharge: 2015-05-13 | Disposition: A | Discharge status: Home Health | Payer: BLUE CROSS/BLUE SHIELD | Source: Ambulatory Visit | Attending: General Surgery | Admitting: General Surgery

## 2015-05-13 ENCOUNTER — Encounter
Admission: RE | Disposition: A | Discharge status: Home Health | Payer: Self-pay | Source: Ambulatory Visit | Attending: General Surgery

## 2015-05-13 DIAGNOSIS — Z91048 Other nonmedicinal substance allergy status: Secondary | ICD-10-CM | POA: Diagnosis not present

## 2015-05-13 DIAGNOSIS — Z803 Family history of malignant neoplasm of breast: Secondary | ICD-10-CM | POA: Diagnosis not present

## 2015-05-13 DIAGNOSIS — E785 Hyperlipidemia, unspecified: Secondary | ICD-10-CM | POA: Insufficient documentation

## 2015-05-13 DIAGNOSIS — K5730 Diverticulosis of large intestine without perforation or abscess without bleeding: Secondary | ICD-10-CM | POA: Diagnosis not present

## 2015-05-13 DIAGNOSIS — E669 Obesity, unspecified: Secondary | ICD-10-CM | POA: Diagnosis not present

## 2015-05-13 DIAGNOSIS — G4730 Sleep apnea, unspecified: Secondary | ICD-10-CM | POA: Insufficient documentation

## 2015-05-13 DIAGNOSIS — K589 Irritable bowel syndrome without diarrhea: Secondary | ICD-10-CM | POA: Insufficient documentation

## 2015-05-13 DIAGNOSIS — Z881 Allergy status to other antibiotic agents status: Secondary | ICD-10-CM | POA: Diagnosis not present

## 2015-05-13 DIAGNOSIS — M1990 Unspecified osteoarthritis, unspecified site: Secondary | ICD-10-CM | POA: Insufficient documentation

## 2015-05-13 DIAGNOSIS — K644 Residual hemorrhoidal skin tags: Secondary | ICD-10-CM

## 2015-05-13 DIAGNOSIS — E079 Disorder of thyroid, unspecified: Secondary | ICD-10-CM | POA: Diagnosis not present

## 2015-05-13 DIAGNOSIS — Z8049 Family history of malignant neoplasm of other genital organs: Secondary | ICD-10-CM | POA: Insufficient documentation

## 2015-05-13 DIAGNOSIS — Z79899 Other long term (current) drug therapy: Secondary | ICD-10-CM | POA: Diagnosis not present

## 2015-05-13 DIAGNOSIS — Z86010 Personal history of colonic polyps: Secondary | ICD-10-CM | POA: Insufficient documentation

## 2015-05-13 DIAGNOSIS — Z6835 Body mass index (BMI) 35.0-35.9, adult: Secondary | ICD-10-CM | POA: Insufficient documentation

## 2015-05-13 HISTORY — PX: COLONOSCOPY WITH PROPOFOL: SHX5780

## 2015-05-13 SURGERY — COLONOSCOPY WITH PROPOFOL
Anesthesia: General

## 2015-05-13 MED ORDER — PROPOFOL 10 MG/ML IV BOLUS
INTRAVENOUS | Status: DC | PRN
  Administered 2015-05-13 (×3): 20 mg via INTRAVENOUS

## 2015-05-13 MED ORDER — SODIUM CHLORIDE 0.9 % IV SOLN
INTRAVENOUS | Status: DC
  Administered 2015-05-13: 1000 mL via INTRAVENOUS

## 2015-05-13 NOTE — Transfer of Care (Signed)
Immediate Anesthesia Transfer of Care Note  Patient: Holly Carroll  Procedure(s) Performed: Procedure(s): COLONOSCOPY WITH PROPOFOL (N/A)  Patient Location: PACU  Anesthesia Type:General  Level of Consciousness: awake, alert  and oriented  Airway & Oxygen Therapy: Patient Spontanous Breathing and Patient connected to nasal cannula oxygen  Post-op Assessment: Report given to RN and Post -op Vital signs reviewed and stable  Post vital signs: Reviewed and stable  Last Vitals:  Filed Vitals:   05/13/15 1235  BP: 129/80  Pulse: 66  Temp: 36.5 C  Resp: 16    Complications: No apparent anesthesia complications

## 2015-05-13 NOTE — Interval H&P Note (Signed)
History and Physical Interval Note:  05/13/2015 1:03 PM  Holly Carroll  has presented today for surgery, with the diagnosis of PH COLON POLYPS  The various methods of treatment have been discussed with the patient and family. After consideration of risks, benefits and other options for treatment, the patient has consented to  Procedure(s): COLONOSCOPY WITH PROPOFOL (N/A) as a surgical intervention .  The patient's history has been reviewed, patient examined, no change in status, stable for surgery.  I have reviewed the patient's chart and labs.  Questions were answered to the patient's satisfaction.     Ilena Dieckman G

## 2015-05-13 NOTE — Op Note (Signed)
[facility] Gastroenterology Patient Name: Montrese Rawlinson Procedure Date: 05/13/2015 1:07 PM MRN: [id number] Account #: [id number] Date of Birth: [birth year] Admit Type: Outpatient Age: 64 Room: [facility] ENDO ROOM 4 Gender: Female Note Status: Finalized Procedure:         Colonoscopy Indications:       High risk colon cancer surveillance: Personal history of                     colonic polyps Providers:         Seeplaputhur G. Jamal Collin, MD Referring MD:      Shirline Frees (Referring MD) Medicines:         General Anesthesia Complications:     No immediate complications. Procedure:         Pre-Anesthesia Assessment:                    - General anesthesia under the supervision of an                     anesthesiologist was determined to be medically necessary                     for this procedure based on review of the patient's                     medical history, medications, and prior anesthesia history.                    After obtaining informed consent, the colonoscope was                     passed under direct vision. Throughout the procedure, the                     patient's blood pressure, pulse, and oxygen saturations                     were monitored continuously. The Colonoscope was                     introduced through the anus and advanced to the the cecum,                     identified by the ileocecal valve. The colonoscopy was                     performed without difficulty. The patient tolerated the                     procedure well. The quality of the bowel preparation was                     good. Findings:      The perianal exam findings include skin tags.      Multiple small and large-mouthed diverticula were found in the entire       colon.      The exam was otherwise without abnormality on direct and retroflexion       views. Impression:        - Perianal skin tags found on perianal exam.                    - Diverticulosis in the  entire examined colon.                    -  The examination was otherwise normal on direct and                     retroflexion views.                    - No specimens collected. Recommendation:    - Discharge patient to home.                    - Repeat colonoscopy in 5 years for surveillance. Procedure Code(s): --- Professional ---                    [phone], Colonoscopy, flexible; diagnostic, including                     collection of specimen(s) by brushing or washing, when                     performed (separate procedure) Diagnosis Code(s): --- Professional ---                    Z86.010, Personal history of colonic polyps                    K64.4, Residual hemorrhoidal skin tags                    K57.30, Diverticulosis of large intestine without                     perforation or abscess without bleeding CPT copyright 2014 American Medical Association. All rights reserved. The codes documented in this report are preliminary and upon coder review may  be revised to meet current compliance requirements. Christene Lye, MD 05/13/2015 1:35:01 PM This report has been signed electronically. Number of Addenda: 0 Note Initiated On: 05/13/2015 1:07 PM Scope Withdrawal Time: 0 hours 8 minutes 18 seconds  Total Procedure Duration: 0 hours 15 minutes 21 seconds       [facility]

## 2015-05-13 NOTE — H&P (Signed)
Holly KLINGER is an 64 y.o. female.   Chief Complaint: here for colonoscopy HPI: Pt has past history of colon polyp and is here for surveillance colonoscopy. No current GI complaints  Past Medical History  Diagnosis Date  . Complication of anesthesia     hard to wake up,memory loss  . Arthritis   . Wears glasses   . Sleep apnea     uses a cpap-will bring dos-will use post op  . Menopause 1996  . Diffuse cystic mastopathy 2013  . Thyroid disease 2011  . Mammographic microcalcification 2011  . Special screening for malignant neoplasms, colon   . Hyperlipidemia 2012  . Obesity, unspecified   . History of IBS 2011  . Colon polyps     Past Surgical History  Procedure Laterality Date  . Cystocele repair    . Rectocele repair    . Heel spur surgery      both feet  . Plantar fascia surgery      right  . Nose surgery      growth on nose as child  . Orif shoulder fracture    . Trigger finger release  03/09/2012    Procedure: RELEASE TRIGGER FINGER/A-1 PULLEY;  Surgeon: Cammie Sickle., MD;  Location: [place];  Service: Orthopedics;  Laterality: Right;  a-1 pulley release right ring   . Dupuytren contracture release  03/09/2012    Procedure: DUPUYTREN CONTRACTURE RELEASE;  Surgeon: Cammie Sickle., MD;  Location: [place];  Service: Orthopedics;  Laterality: Right;  . Carpal tunnel release  03/09/2012    Procedure: CARPAL TUNNEL RELEASE;  Surgeon: Cammie Sickle., MD;  Location: [place];  Service: Orthopedics;  Laterality: Right;  . Breast surgery Right October 2011    stereo bx  . Colonoscopy  2009    Dr. Vira Agar  . Breast cyst aspiration Right 2005  . Biopsy thyroid  2009    nodule    Family History  Problem Relation Age of Onset  . Breast cancer Maternal Grandmother   . Cervical cancer Mother    Social History:  reports that she has never smoked. She has never used smokeless tobacco. She reports that she  does not drink alcohol or use illicit drugs.  Allergies:  Allergies  Allergen Reactions  . Doxycycline     Losses balance  . Tape     blister    Medications Prior to Admission  Medication Sig Dispense Refill  . lovastatin (MEVACOR) 40 MG tablet Take by mouth.    . polyethylene glycol powder (GLYCOLAX/MIRALAX) powder Take 255 g by mouth once. 255 g 0  . SYNTHROID 50 MCG tablet Take 50 mcg by mouth daily.       No results found for this or any previous visit (from the past 48 hour(s)). No results found.  Review of Systems  Constitutional: Negative.   Respiratory: Negative.   Cardiovascular: Negative.   Gastrointestinal: Negative.   Genitourinary: Negative.   Musculoskeletal: Negative.     Blood pressure 129/80, pulse 66, temperature 97.7 F (36.5 C), temperature source Tympanic, resp. rate 16, height 5\' 5"  (1.651 m), weight 214 lb (97.07 kg), SpO2 97 %. Physical Exam  Constitutional: She appears well-developed and well-nourished.  Eyes: Conjunctivae are normal. No scleral icterus.  Neck: Normal range of motion. Neck supple.  Cardiovascular: Normal rate, regular rhythm and normal heart sounds.   Respiratory: Effort normal and breath sounds normal.  GI: Soft.  Bowel sounds are normal. There is no hepatomegaly. There is no tenderness. No hernia.     Assessment/Plan Stable exam. Ok to proceed with colonoscopy as discussed on her last OV on 03/25/15  Eason Housman G 05/13/2015, 1:00 PM

## 2015-05-13 NOTE — Anesthesia Postprocedure Evaluation (Signed)
Anesthesia Post Note  Patient: Holly Carroll  Procedure(s) Performed: Procedure(s) (LRB): COLONOSCOPY WITH PROPOFOL (N/A)  Patient location during evaluation: Endoscopy Anesthesia Type: General Level of consciousness: awake Pain management: pain level controlled Vital Signs Assessment: post-procedure vital signs reviewed and stable Respiratory status: spontaneous breathing Cardiovascular status: blood pressure returned to baseline Anesthetic complications: no    Last Vitals:  Filed Vitals:   05/13/15 1355 05/13/15 1405  BP: 125/79 132/77  Pulse: 63 61  Temp:    Resp: 16 22    Last Pain: There were no vitals filed for this visit.               Paez,Kenetha Cozza S

## 2015-05-13 NOTE — Anesthesia Preprocedure Evaluation (Addendum)
Anesthesia Evaluation  Patient identified by MRN, date of birth, ID band Patient awake    Reviewed: Allergy & Precautions, NPO status , Patient's Chart, lab work & pertinent test results, reviewed documented beta blocker date and time   History of Anesthesia Complications (+) history of anesthetic complications  Airway Mallampati: III  TM Distance: >3 FB     Dental  (+) Chipped   Pulmonary sleep apnea and Continuous Positive Airway Pressure Ventilation ,           Cardiovascular      Neuro/Psych    GI/Hepatic   Endo/Other    Renal/GU      Musculoskeletal  (+) Arthritis ,   Abdominal   Peds  Hematology   Anesthesia Other Findings Obesity. Will use cpap tonite.  Reproductive/Obstetrics                            Anesthesia Physical Anesthesia Plan  ASA: III  Anesthesia Plan: General   Post-op Pain Management:    Induction: Intravenous  Airway Management Planned: Nasal Cannula  Additional Equipment:   Intra-op Plan:   Post-operative Plan:   Informed Consent: I have reviewed the patients History and Physical, chart, labs and discussed the procedure including the risks, benefits and alternatives for the proposed anesthesia with the patient or authorized representative who has indicated his/her understanding and acceptance.     Plan Discussed with: CRNA  Anesthesia Plan Comments:         Anesthesia Quick Evaluation

## 2015-05-15 ENCOUNTER — Encounter: Payer: Self-pay | Admitting: General Surgery

## 2015-06-04 ENCOUNTER — Encounter: Payer: Self-pay | Admitting: General Surgery

## 2015-11-20 ENCOUNTER — Encounter
Admission: RE | Disposition: A | Discharge status: Home / Self Care | Payer: Self-pay | Source: Ambulatory Visit | Attending: Ophthalmology

## 2015-11-20 ENCOUNTER — Encounter: Payer: Self-pay | Admitting: *Deleted

## 2015-11-20 ENCOUNTER — Ambulatory Visit: Payer: BLUE CROSS/BLUE SHIELD | Admitting: Anesthesiology

## 2015-11-20 ENCOUNTER — Ambulatory Visit
Admission: RE | Admit: 2015-11-20 | Discharge: 2015-11-20 | Disposition: A | Discharge status: Home / Self Care | Payer: BLUE CROSS/BLUE SHIELD | Source: Ambulatory Visit | Attending: Ophthalmology | Admitting: Ophthalmology

## 2015-11-20 DIAGNOSIS — M1990 Unspecified osteoarthritis, unspecified site: Secondary | ICD-10-CM | POA: Insufficient documentation

## 2015-11-20 DIAGNOSIS — M810 Age-related osteoporosis without current pathological fracture: Secondary | ICD-10-CM | POA: Diagnosis not present

## 2015-11-20 DIAGNOSIS — E785 Hyperlipidemia, unspecified: Secondary | ICD-10-CM | POA: Diagnosis not present

## 2015-11-20 DIAGNOSIS — E669 Obesity, unspecified: Secondary | ICD-10-CM | POA: Insufficient documentation

## 2015-11-20 DIAGNOSIS — H9190 Unspecified hearing loss, unspecified ear: Secondary | ICD-10-CM | POA: Insufficient documentation

## 2015-11-20 DIAGNOSIS — Z86010 Personal history of colonic polyps: Secondary | ICD-10-CM | POA: Diagnosis not present

## 2015-11-20 DIAGNOSIS — G4730 Sleep apnea, unspecified: Secondary | ICD-10-CM | POA: Diagnosis not present

## 2015-11-20 DIAGNOSIS — H2511 Age-related nuclear cataract, right eye: Secondary | ICD-10-CM | POA: Diagnosis not present

## 2015-11-20 DIAGNOSIS — Z8719 Personal history of other diseases of the digestive system: Secondary | ICD-10-CM | POA: Diagnosis not present

## 2015-11-20 DIAGNOSIS — E039 Hypothyroidism, unspecified: Secondary | ICD-10-CM | POA: Diagnosis not present

## 2015-11-20 HISTORY — DX: Personal history of other diseases of the respiratory system: Z87.09

## 2015-11-20 HISTORY — DX: Diverticulosis of intestine, part unspecified, without perforation or abscess without bleeding: K57.90

## 2015-11-20 HISTORY — DX: Age-related osteoporosis without current pathological fracture: M81.0

## 2015-11-20 HISTORY — DX: Hypothyroidism, unspecified: E03.9

## 2015-11-20 HISTORY — DX: Unspecified hearing loss, unspecified ear: H91.90

## 2015-11-20 HISTORY — PX: CATARACT EXTRACTION W/PHACO: SHX586

## 2015-11-20 SURGERY — PHACOEMULSIFICATION, CATARACT, WITH IOL INSERTION
Anesthesia: Monitor Anesthesia Care | Site: Eye | Laterality: Right | Wound class: Clean

## 2015-11-20 MED ORDER — FENTANYL CITRATE (PF) 100 MCG/2ML IJ SOLN
INTRAMUSCULAR | Status: DC | PRN
  Administered 2015-11-20: 25 ug via INTRAVENOUS

## 2015-11-20 MED ORDER — LIDOCAINE HCL (PF) 4 % IJ SOLN
INTRAMUSCULAR | Status: AC
  Filled 2015-11-20: qty 5

## 2015-11-20 MED ORDER — EPINEPHRINE HCL 1 MG/ML IJ SOLN
INTRAMUSCULAR | Status: AC
  Filled 2015-11-20: qty 1

## 2015-11-20 MED ORDER — MIDAZOLAM HCL 2 MG/2ML IJ SOLN
INTRAMUSCULAR | Status: DC | PRN
  Administered 2015-11-20: 1 mg via INTRAVENOUS

## 2015-11-20 MED ORDER — SODIUM HYALURONATE 10 MG/ML IO SOLN
INTRAOCULAR | Status: AC
  Filled 2015-11-20: qty 0.85

## 2015-11-20 MED ORDER — CEFUROXIME OPHTHALMIC INJECTION 1 MG/0.1 ML
INJECTION | OPHTHALMIC | Status: DC | PRN
  Administered 2015-11-20: 0.1 mL via INTRACAMERAL

## 2015-11-20 MED ORDER — MOXIFLOXACIN HCL 0.5 % OP SOLN: 1.0000 [drp] | OPHTHALMIC | Status: AC | PRN

## 2015-11-20 MED ORDER — EPINEPHRINE HCL 1 MG/ML IJ SOLN
INTRAOCULAR | Status: DC | PRN
  Administered 2015-11-20: 09:00:00 via OPHTHALMIC

## 2015-11-20 MED ORDER — BSS IO SOLN
INTRAOCULAR | Status: DC | PRN
  Administered 2015-11-20: 09:00:00 via OPHTHALMIC

## 2015-11-20 MED ORDER — SODIUM HYALURONATE 23 MG/ML IO SOLN
INTRAOCULAR | Status: AC
  Filled 2015-11-20: qty 0.6

## 2015-11-20 MED ORDER — TETRACAINE HCL 0.5 % OP SOLN
1.0000 [drp] | OPHTHALMIC | Status: AC | PRN
Start: 2015-11-20 — End: 2015-11-20
  Administered 2015-11-20: 1 [drp] via OPHTHALMIC

## 2015-11-20 MED ORDER — MOXIFLOXACIN HCL 0.5 % OP SOLN
OPHTHALMIC | Status: DC | PRN
  Administered 2015-11-20: 1 [drp] via OPHTHALMIC

## 2015-11-20 MED ORDER — SODIUM HYALURONATE 23 MG/ML IO SOLN
INTRAOCULAR | Status: DC | PRN
  Administered 2015-11-20: 0.6 mL via INTRAOCULAR

## 2015-11-20 MED ORDER — SODIUM HYALURONATE 10 MG/ML IO SOLN
INTRAOCULAR | Status: DC | PRN
  Administered 2015-11-20: 0.85 mL via INTRAOCULAR

## 2015-11-20 MED ORDER — ARMC OPHTHALMIC DILATING GEL
1.0000 "application " | OPHTHALMIC | Status: AC | PRN
  Administered 2015-11-20 (×2): 1 via OPHTHALMIC

## 2015-11-20 MED ORDER — POVIDONE-IODINE 5 % OP SOLN
1.0000 "application " | OPHTHALMIC | Status: AC | PRN
  Administered 2015-11-20: 1 via OPHTHALMIC

## 2015-11-20 MED ORDER — CARBACHOL 0.01 % IO SOLN
INTRAOCULAR | Status: DC | PRN
  Administered 2015-11-20: 0.5 mL via INTRAOCULAR

## 2015-11-20 MED ORDER — SODIUM CHLORIDE 0.9 % IV SOLN
INTRAVENOUS | Status: DC
  Administered 2015-11-20 (×2): via INTRAVENOUS

## 2015-11-20 SURGICAL SUPPLY — 22 items
CANNULA ANT/CHMB 27GA (MISCELLANEOUS) ×6 IMPLANT
CUP MEDICINE 2OZ PLAST GRAD ST (MISCELLANEOUS) ×3 IMPLANT
GLOVE BIO SURGEON STRL SZ8 (GLOVE) ×3 IMPLANT
GLOVE BIOGEL M 6.5 STRL (GLOVE) ×3 IMPLANT
GLOVE SURG LX 7.5 STRW (GLOVE) ×2
GLOVE SURG LX STRL 7.5 STRW (GLOVE) ×1 IMPLANT
GOWN STRL REUS W/ TWL LRG LVL3 (GOWN DISPOSABLE) ×2 IMPLANT
GOWN STRL REUS W/TWL LRG LVL3 (GOWN DISPOSABLE) ×4
LENS IOL ACRSF IQ PC 21.5 (Intraocular Lens) ×1 IMPLANT
LENS IOL ACRYSOF IQ POST 21.5 (Intraocular Lens) ×3 IMPLANT
PACK CATARACT (MISCELLANEOUS) ×3 IMPLANT
PACK CATARACT BRASINGTON LX (MISCELLANEOUS) ×3 IMPLANT
PACK EYE AFTER SURG (MISCELLANEOUS) ×3 IMPLANT
SOL BSS BAG (MISCELLANEOUS) ×3
SOL PREP PVP 2OZ (MISCELLANEOUS) ×3
SOLUTION BSS BAG (MISCELLANEOUS) ×1 IMPLANT
SOLUTION PREP PVP 2OZ (MISCELLANEOUS) ×1 IMPLANT
SYR 3ML LL SCALE MARK (SYRINGE) ×6 IMPLANT
SYR 5ML LL (SYRINGE) ×3 IMPLANT
SYR TB 1ML 27GX1/2 LL (SYRINGE) ×3 IMPLANT
WATER STERILE IRR 1000ML POUR (IV SOLUTION) ×3 IMPLANT
WIPE NON LINTING 3.25X3.25 (MISCELLANEOUS) ×3 IMPLANT

## 2015-11-20 NOTE — Discharge Instructions (Signed)
AMBULATORY SURGERY  DISCHARGE INSTRUCTIONS   1) The drugs that you were given will stay in your system until tomorrow so for the next 24 hours you should not:  A) Drive an automobile B) Make any legal decisions C) Drink any alcoholic beverage   2) You may resume regular meals tomorrow.  Today it is better to start with liquids and gradually work up to solid foods.  You may eat anything you prefer, but it is better to start with liquids, then soup and crackers, and gradually work up to solid foods.   3) Please notify your doctor immediately if you have any unusual bleeding, trouble breathing, redness and pain at the surgery site, drainage, fever, or pain not relieved by medication.    4) Additional Instructions:    Eye Surgery Discharge Instructions  Expect mild scratchy sensation or mild soreness. DO NOT RUB YOUR EYE!  The day of surgery:  Minimal physical activity, but bed rest is not required  No reading, computer work, or close hand work  No bending, lifting, or straining.  May watch TV  For 24 hours:  No driving, legal decisions, or alcoholic beverages  Safety precautions  Eat anything you prefer: It is better to start with liquids, then soup then solid foods.  _____ Eye patch should be worn until postoperative exam tomorrow.  ____ Solar shield eyeglasses should be worn for comfort in the sunlight/patch while sleeping  Resume all regular medications including aspirin or Coumadin if these were discontinued prior to surgery. You may shower, bathe, shave, or wash your hair. Tylenol may be taken for mild discomfort.  Call your doctor if you experience significant pain, nausea, or vomiting, fever > 101 or other signs of infection. [phone] or [phone] Specific instructions:  Follow-up Information    Follow up with [name], MD In 1 day.   Specialty:  Ophthalmology   Why:  June 30 at [facility] information:   [place]  [place] [address] [phone]         Please contact your physician with any problems or Same Day Surgery at [phone], Monday through Friday 6 am to 4 pm, or [place] at [facility] number at [phone].

## 2015-11-20 NOTE — Transfer of Care (Signed)
Immediate Anesthesia Transfer of Care Note  Patient: Holly Carroll  Procedure(s) Performed: Procedure(s) with comments: CATARACT EXTRACTION PHACO AND INTRAOCULAR LENS PLACEMENT (IOC) (Right) - [place] 1.08 AP% 13.9 CDE 9.50 Fluid pack lot # [id number]  Patient Location:    Anesthesia Type:MAC  Level of Consciousness: awake  Airway & Oxygen Therapy: Patient Spontanous Breathing  Post-op Assessment: Report given to RN  Post vital signs: Reviewed and stable  Last Vitals:  Filed Vitals:   11/20/15 0819  BP: 124/76  Pulse: 66  Temp: 37.1 C  Resp: 18    Last Pain: There were no vitals filed for this visit.       Complications: No apparent anesthesia complications

## 2015-11-20 NOTE — Anesthesia Procedure Notes (Signed)
Date/Time: 11/20/2015 9:30 AM Performed by: Allean Found Pre-anesthesia Checklist: Patient identified, Emergency Drugs available, Suction available, Patient being monitored and Timeout performed Patient Re-evaluated:Patient Re-evaluated prior to inductionOxygen Delivery Method: Nasal cannula Intubation Type: IV induction Placement Confirmation: positive ETCO2

## 2015-11-20 NOTE — Anesthesia Preprocedure Evaluation (Signed)
Anesthesia Evaluation  Patient identified by MRN, date of birth, ID band Patient awake    Reviewed: Allergy & Precautions, H&P , NPO status , Patient's Chart, lab work & pertinent test results, reviewed documented beta blocker date and time   History of Anesthesia Complications (+) history of anesthetic complications  Airway Mallampati: III  TM Distance: <3 FB Neck ROM: limited    Dental  (+) Chipped, Poor Dentition   Pulmonary sleep apnea and Continuous Positive Airway Pressure Ventilation ,           Cardiovascular      Neuro/Psych    GI/Hepatic   Endo/Other  Hypothyroidism   Renal/GU      Musculoskeletal  (+) Arthritis ,   Abdominal   Peds  Hematology   Anesthesia Other Findings Past Medical History:   Arthritis                                                    Wears glasses                                                Menopause                                       1996         Diffuse cystic mastopathy                       2013         Thyroid disease                                 2011         Mammographic microcalcification                 2011         Special screening for malignant neoplasms, col*              Hyperlipidemia                                  2012         Obesity, unspecified                                         History of IBS                                  2011         Colon polyps                                                 Complication of anesthesia  Comment:hard to wake up,memory loss   Hypothyroidism                                               Sleep apnea                                                    Comment:uses a cpap-will bring dos-will use post op   History of bronchitis                                        Diverticulosis                                               HOH (hard of hearing)                                         Osteoporosis                                                 Reproductive/Obstetrics                             Anesthesia Physical  Anesthesia Plan  ASA: III  Anesthesia Plan: MAC   Post-op Pain Management:    Induction: Intravenous  Airway Management Planned: Nasal Cannula  Additional Equipment:   Intra-op Plan:   Post-operative Plan:   Informed Consent: I have reviewed the patients History and Physical, chart, labs and discussed the procedure including the risks, benefits and alternatives for the proposed anesthesia with the patient or authorized representative who has indicated his/her understanding and acceptance.     Plan Discussed with: CRNA  Anesthesia Plan Comments:         Anesthesia Quick Evaluation

## 2015-11-20 NOTE — Op Note (Signed)
OPERATIVE NOTE  Holly Carroll [id number] 11/20/2015   PREOPERATIVE DIAGNOSIS:  Nuclear sclerotic cataract right eye.  H25.11   POSTOPERATIVE DIAGNOSIS:    Nuclear sclerotic cataract right eye.     PROCEDURE:  Phacoemusification with posterior chamber intraocular lens placement of the right eye   LENS:   Implant Name Type Inv. Item Serial No. Manufacturer Lot No. LRB No. Used  IMPLANT LENS - [id number] Intraocular Lens IMPLANT LENS [id number] ALCON   Right 1       [id number] 21.5   ULTRASOUND TIME: 1 minutes 08 seconds.  CDE 9.50   SURGEON:  [name], MD, MPH  ANESTHESIOLOGIST: Anesthesiologist: Andria Frames, MD CRNA: Allean Found, CRNA   ANESTHESIA:  Topical with tetracaine drops and 2% Xylocaine jelly, augmented with 1% preservative-free intracameral lidocaine.  ESTIMATED BLOOD LOSS: less than 1 mL.   COMPLICATIONS:  None.   DESCRIPTION OF PROCEDURE:  The patient was identified in the holding room and transported to the operating room and placed in the supine position under the operating microscope.  The right eye was identified as the operative eye and it was prepped and draped in the usual sterile ophthalmic fashion.   A 1.0 millimeter clear-corneal paracentesis was made at the 10:30 position. 0.5 ml of preservative-free 1% lidocaine with epinephrine was injected into the anterior chamber.  The anterior chamber was filled with Healon 5.  A 2.4 millimeter keratome was used to make a near-clear corneal incision at the 8:00 position.  A curvilinear capsulorrhexis was made with a cystotome and capsulorrhexis forceps.  Balanced salt solution was used to hydrodissect and hydrodelineate the nucleus.   Phacoemulsification was then used in stop and chop fashion to remove the lens nucleus and epinucleus.  The remaining cortex was then removed using the irrigation and aspiration handpiece. Healon was then placed into the capsular bag to distend it for lens placement.  A lens  was then injected into the capsular bag.  The remaining viscoelastic was aspirated.   Wounds were hydrated with balanced salt solution.  The anterior chamber was inflated to a physiologic pressure with balanced salt solution.   Intracameral cefuroxime 0.1 mL at 10mg /mL was injected into the eye.  No wound leaks were noted.  Topical Vigamox drops were applied to the eye.  The patient was taken to the recovery room in stable condition without complications of anesthesia or surgery  [name] 11/20/2015, 9:48 AM

## 2015-11-20 NOTE — H&P (Signed)
  The History and Physical notes are on paper, have been signed, and are to be scanned. The patient remains stable and unchanged from the H&P.   Previous H&P reviewed, patient examined, and there are no changes.  [name] 11/20/2015 8:14 AM

## 2015-11-20 NOTE — Anesthesia Postprocedure Evaluation (Signed)
Anesthesia Post Note  Patient: Holly Carroll  Procedure(s) Performed: Procedure(s) (LRB): CATARACT EXTRACTION PHACO AND INTRAOCULAR LENS PLACEMENT (IOC) (Right)  Patient location during evaluation: PACU Anesthesia Type: MAC Level of consciousness: awake Pain management: pain level controlled Vital Signs Assessment: post-procedure vital signs reviewed and stable Respiratory status: spontaneous breathing Cardiovascular status: blood pressure returned to baseline Postop Assessment: no headache Anesthetic complications: no    Last Vitals:  Filed Vitals:   11/20/15 0819  BP: 124/76  Pulse: 66  Temp: 37.1 C  Resp: 18    Last Pain: There were no vitals filed for this visit.               [name]

## 2016-02-05 ENCOUNTER — Encounter: Payer: Self-pay | Admitting: *Deleted

## 2016-02-12 ENCOUNTER — Encounter: Payer: Self-pay | Admitting: *Deleted

## 2016-02-19 ENCOUNTER — Ambulatory Visit: Payer: BLUE CROSS/BLUE SHIELD | Admitting: *Deleted

## 2016-02-19 ENCOUNTER — Encounter: Payer: Self-pay | Admitting: *Deleted

## 2016-02-19 ENCOUNTER — Encounter
Admission: RE | Disposition: A | Discharge status: Home / Self Care | Payer: Self-pay | Source: Ambulatory Visit | Attending: Ophthalmology

## 2016-02-19 ENCOUNTER — Ambulatory Visit
Admission: RE | Admit: 2016-02-19 | Discharge: 2016-02-19 | Disposition: A | Discharge status: Home / Self Care | Payer: BLUE CROSS/BLUE SHIELD | Source: Ambulatory Visit | Attending: Ophthalmology | Admitting: Ophthalmology

## 2016-02-19 DIAGNOSIS — M1990 Unspecified osteoarthritis, unspecified site: Secondary | ICD-10-CM | POA: Insufficient documentation

## 2016-02-19 DIAGNOSIS — E039 Hypothyroidism, unspecified: Secondary | ICD-10-CM | POA: Insufficient documentation

## 2016-02-19 DIAGNOSIS — G4730 Sleep apnea, unspecified: Secondary | ICD-10-CM | POA: Diagnosis not present

## 2016-02-19 DIAGNOSIS — Z79899 Other long term (current) drug therapy: Secondary | ICD-10-CM | POA: Diagnosis not present

## 2016-02-19 DIAGNOSIS — H2512 Age-related nuclear cataract, left eye: Secondary | ICD-10-CM | POA: Diagnosis present

## 2016-02-19 DIAGNOSIS — Z791 Long term (current) use of non-steroidal anti-inflammatories (NSAID): Secondary | ICD-10-CM | POA: Diagnosis not present

## 2016-02-19 DIAGNOSIS — Z9989 Dependence on other enabling machines and devices: Secondary | ICD-10-CM | POA: Diagnosis not present

## 2016-02-19 HISTORY — DX: Cutaneous abscess, unspecified: L02.91

## 2016-02-19 HISTORY — PX: CATARACT EXTRACTION W/PHACO: SHX586

## 2016-02-19 SURGERY — PHACOEMULSIFICATION, CATARACT, WITH IOL INSERTION
Anesthesia: Monitor Anesthesia Care | Site: Eye | Laterality: Left | Wound class: Clean

## 2016-02-19 MED ORDER — MOXIFLOXACIN HCL 0.5 % OP SOLN
1.0000 [drp] | OPHTHALMIC | Status: AC
  Administered 2016-02-19: 1 [drp] via OPHTHALMIC

## 2016-02-19 MED ORDER — MOXIFLOXACIN HCL 0.5 % OP SOLN
OPHTHALMIC | Status: AC
  Filled 2016-02-19: qty 3

## 2016-02-19 MED ORDER — MIDAZOLAM HCL 2 MG/2ML IJ SOLN
INTRAMUSCULAR | Status: DC | PRN
  Administered 2016-02-19: 1 mg via INTRAVENOUS

## 2016-02-19 MED ORDER — MOXIFLOXACIN HCL 0.5 % OP SOLN
OPHTHALMIC | Status: DC | PRN
  Administered 2016-02-19: 1 [drp] via OPHTHALMIC

## 2016-02-19 MED ORDER — LIDOCAINE HCL 3.5 % OP GEL
OPHTHALMIC | Status: AC
  Filled 2016-02-19: qty 1

## 2016-02-19 MED ORDER — CYCLOPENTOLATE HCL 2 % OP SOLN
OPHTHALMIC | Status: AC
  Administered 2016-02-19: 08:00:00
  Filled 2016-02-19: qty 2

## 2016-02-19 MED ORDER — CYCLOPENTOLATE HCL 2 % OP SOLN: 1.0000 [drp] | OPHTHALMIC | Status: AC

## 2016-02-19 MED ORDER — PHENYLEPHRINE HCL 10 % OP SOLN
1.0000 [drp] | OPHTHALMIC | Status: AC
  Administered 2016-02-19 (×3): 1 [drp] via OPHTHALMIC

## 2016-02-19 MED ORDER — POVIDONE-IODINE 5 % OP SOLN
1.0000 "application " | Freq: Once | OPHTHALMIC | Status: AC
  Administered 2016-02-19: 1 via OPHTHALMIC

## 2016-02-19 MED ORDER — SODIUM HYALURONATE 10 MG/ML IO SOLN
INTRAOCULAR | Status: DC | PRN
  Administered 2016-02-19: 0.85 mL via INTRAOCULAR

## 2016-02-19 MED ORDER — TETRACAINE HCL 0.5 % OP SOLN
1.0000 [drp] | Freq: Once | OPHTHALMIC | Status: AC
  Administered 2016-02-19: 1 [drp] via OPHTHALMIC

## 2016-02-19 MED ORDER — SODIUM HYALURONATE 23 MG/ML IO SOLN
INTRAOCULAR | Status: AC
  Filled 2016-02-19: qty 0.6

## 2016-02-19 MED ORDER — BSS IO SOLN
INTRAOCULAR | Status: DC | PRN
  Administered 2016-02-19: 4 mL via OPHTHALMIC

## 2016-02-19 MED ORDER — TETRACAINE HCL 0.5 % OP SOLN
OPHTHALMIC | Status: AC
  Filled 2016-02-19: qty 2

## 2016-02-19 MED ORDER — POVIDONE-IODINE 5 % OP SOLN
OPHTHALMIC | Status: AC
  Filled 2016-02-19: qty 30

## 2016-02-19 MED ORDER — SODIUM CHLORIDE 0.9 % IV SOLN
INTRAVENOUS | Status: DC
  Administered 2016-02-19: 08:00:00 via INTRAVENOUS

## 2016-02-19 MED ORDER — PHENYLEPHRINE HCL 10 % OP SOLN
OPHTHALMIC | Status: AC
  Filled 2016-02-19: qty 5

## 2016-02-19 MED ORDER — SODIUM HYALURONATE 23 MG/ML IO SOLN
INTRAOCULAR | Status: DC | PRN
  Administered 2016-02-19: 0.6 mL via INTRAOCULAR

## 2016-02-19 MED ORDER — LIDOCAINE HCL (PF) 4 % IJ SOLN
INTRAMUSCULAR | Status: AC
  Filled 2016-02-19: qty 5

## 2016-02-19 MED ORDER — EPINEPHRINE HCL 1 MG/ML IJ SOLN
INTRAOCULAR | Status: DC | PRN
  Administered 2016-02-19: 1 mL via OPHTHALMIC

## 2016-02-19 MED ORDER — EPINEPHRINE HCL 1 MG/ML IJ SOLN
INTRAMUSCULAR | Status: AC
  Filled 2016-02-19: qty 2

## 2016-02-19 MED ORDER — SODIUM HYALURONATE 10 MG/ML IO SOLN
INTRAOCULAR | Status: AC
  Filled 2016-02-19: qty 0.85

## 2016-02-19 MED ORDER — LIDOCAINE HCL 3.5 % OP GEL: 1.0000 "application " | Freq: Once | OPHTHALMIC | Status: DC

## 2016-02-19 SURGICAL SUPPLY — 22 items
CANNULA ANT/CHMB 27GA (MISCELLANEOUS) ×6 IMPLANT
CUP MEDICINE 2OZ PLAST GRAD ST (MISCELLANEOUS) ×3 IMPLANT
GLOVE BIO SURGEON STRL SZ8 (GLOVE) ×3 IMPLANT
GLOVE BIOGEL M 6.5 STRL (GLOVE) ×3 IMPLANT
GLOVE SURG LX 7.5 STRW (GLOVE) ×2
GLOVE SURG LX STRL 7.5 STRW (GLOVE) ×1 IMPLANT
GOWN STRL REUS W/ TWL LRG LVL3 (GOWN DISPOSABLE) ×2 IMPLANT
GOWN STRL REUS W/TWL LRG LVL3 (GOWN DISPOSABLE) ×4
LENS IOL ACRSF IQ PC 21.5 (Intraocular Lens) ×1 IMPLANT
LENS IOL ACRYSOF IQ POST 21.5 (Intraocular Lens) ×3 IMPLANT
PACK CATARACT (MISCELLANEOUS) ×3 IMPLANT
PACK CATARACT BRASINGTON LX (MISCELLANEOUS) ×3 IMPLANT
PACK EYE AFTER SURG (MISCELLANEOUS) ×3 IMPLANT
SOL BSS BAG (MISCELLANEOUS) ×3
SOL PREP PVP 2OZ (MISCELLANEOUS) ×3
SOLUTION BSS BAG (MISCELLANEOUS) ×1 IMPLANT
SOLUTION PREP PVP 2OZ (MISCELLANEOUS) ×1 IMPLANT
SYR 3ML LL SCALE MARK (SYRINGE) ×6 IMPLANT
SYR 5ML LL (SYRINGE) ×3 IMPLANT
SYR TB 1ML 27GX1/2 LL (SYRINGE) ×3 IMPLANT
WATER STERILE IRR 250ML POUR (IV SOLUTION) ×3 IMPLANT
WIPE NON LINTING 3.25X3.25 (MISCELLANEOUS) ×3 IMPLANT

## 2016-02-19 NOTE — Discharge Instructions (Signed)
Eye Surgery Discharge Instructions  Expect mild scratchy sensation or mild soreness. DO NOT RUB YOUR EYE!  The day of surgery:  Minimal physical activity, but bed rest is not required  No reading, computer work, or close hand work  No bending, lifting, or straining.  May watch TV  For 24 hours:  No driving, legal decisions, or alcoholic beverages  Safety precautions  Eat anything you prefer: It is better to start with liquids, then soup then solid foods.  _____ Eye patch should be worn until postoperative exam tomorrow.  ____ Solar shield eyeglasses should be worn for comfort in the sunlight/patch while sleeping  Resume all regular medications including aspirin or Coumadin if these were discontinued prior to surgery. You may shower, bathe, shave, or wash your hair. Tylenol may be taken for mild discomfort.  Call your doctor if you experience significant pain, nausea, or vomiting, fever > 101 or other signs of infection. [phone] or [phone] Specific instructions:  Follow-up Information    [name], MD .   Specialty:  Ophthalmology Why:  September 29 at 9:15am Contact information: [address] [place] [address] [phone]

## 2016-02-19 NOTE — Op Note (Signed)
OPERATIVE NOTE  Holly Carroll [id number] 02/19/2016   PREOPERATIVE DIAGNOSIS:  Nuclear sclerotic cataract left eye.  H25.12   POSTOPERATIVE DIAGNOSIS:    Nuclear sclerotic cataract left eye.     PROCEDURE:  Phacoemusification with posterior chamber intraocular lens placement of the left eye   LENS:   Implant Name Type Inv. Item Serial No. Manufacturer Lot No. LRB No. Used  IMPLANT LENS - [id number] Intraocular Lens IMPLANT LENS [id number] ALCON   Left 1       SN60WF 21.5   ULTRASOUND TIME: 0 minutes 42.5 seconds.  CDE 5.41   SURGEON:  [name], MD, MPH   ANESTHESIA:  Topical with tetracaine drops and 2% Xylocaine jelly, augmented with 1% preservative-free intracameral lidocaine.   COMPLICATIONS:  None.   DESCRIPTION OF PROCEDURE:  The patient was identified in the holding room and transported to the operating room and placed in the supine position under the operating microscope.  The left eye was identified as the operative eye and it was prepped and draped in the usual sterile ophthalmic fashion.   A 1.0 millimeter clear-corneal paracentesis was made at the 5:00 position. 0.5 ml of preservative-free 1% lidocaine with epinephrine was injected into the anterior chamber.  The anterior chamber was filled with Healon 5 viscoelastic.  A 2.4 millimeter keratome was used to make a near-clear corneal incision at the 2:00 position.  A curvilinear capsulorrhexis was made with a cystotome and capsulorrhexis forceps.  Balanced salt solution was used to hydrodissect and hydrodelineate the nucleus.   Phacoemulsification was then used in stop and chop fashion to remove the lens nucleus and epinucleus.  The remaining cortex was then removed using the irrigation and aspiration handpiece. Healon was then placed into the capsular bag to distend it for lens placement.  A lens was then injected into the capsular bag.  The remaining viscoelastic was aspirated.   Wounds were hydrated with balanced  salt solution.  The anterior chamber was inflated to a physiologic pressure with balanced salt solution.  Good routine case.  Intracameral vigamox 0.1 mL undiltued was injected into the eye.  No wound leaks were noted.  Topical Vigamox drops were applied to the eye.  The patient was taken to the recovery room in stable condition without complications of anesthesia or surgery  [name] 02/19/2016, 9:11 AM

## 2016-02-19 NOTE — Anesthesia Postprocedure Evaluation (Signed)
Anesthesia Post Note  Patient: Holly Carroll  Procedure(s) Performed: Procedure(s) (LRB): CATARACT EXTRACTION PHACO AND INTRAOCULAR LENS PLACEMENT (IOC) (Left)  Patient location during evaluation: PACU Anesthesia Type: MAC Level of consciousness: awake and alert and oriented Pain management: satisfactory to patient Vital Signs Assessment: post-procedure vital signs reviewed and stable Respiratory status: respiratory function stable and spontaneous breathing Cardiovascular status: blood pressure returned to baseline and stable Anesthetic complications: no    Last Vitals:  Vitals:   02/19/16 0743  BP: 123/74  Pulse: 72  Resp: 18  Temp: 36.7 C    Last Pain:  Vitals:   02/19/16 0743  TempSrc: Oral                 Blima Singer

## 2016-02-19 NOTE — Transfer of Care (Signed)
Immediate Anesthesia Transfer of Care Note  Patient: Holly Carroll  Procedure(s) Performed: Procedure(s) with comments: CATARACT EXTRACTION PHACO AND INTRAOCULAR LENS PLACEMENT (IOC) (Left) - [place] 42.5 AP% 12.8 CDE 5.41 Fluid Pack Lot # [id number]  Patient Location: PACU  Anesthesia Type:MAC  Level of Consciousness: awake, alert  and oriented  Airway & Oxygen Therapy: Patient Spontanous Breathing  Post-op Assessment: Report given to RN and Post -op Vital signs reviewed and stable  Post vital signs: Reviewed and stable  Last Vitals:  Vitals:   02/19/16 0743  BP: 123/74  Pulse: 72  Resp: 18  Temp: 36.7 C    Last Pain:  Vitals:   02/19/16 0743  TempSrc: Oral         Complications: No apparent anesthesia complications

## 2016-02-19 NOTE — Addendum Note (Signed)
Addendum  created 02/19/16 [id number] by Demetrius Charity, CRNA   Charge Capture section accepted

## 2016-02-19 NOTE — H&P (Signed)
The History and Physical notes are on paper, have been signed, and are to be scanned. The patient remains stable and unchanged from the H&P.   Previous H&P reviewed, patient examined, and there are no changes.  Holly Carroll 02/19/2016 8:42 AM   

## 2016-02-19 NOTE — Anesthesia Preprocedure Evaluation (Signed)
Anesthesia Evaluation  Patient identified by MRN, date of birth, ID band Patient awake    Reviewed: Allergy & Precautions, H&P , NPO status , Patient's Chart, lab work & pertinent test results, reviewed documented beta blocker date and time   History of Anesthesia Complications (+) history of anesthetic complications  Airway Mallampati: III  TM Distance: <3 FB Neck ROM: limited    Dental  (+) Chipped, Poor Dentition   Pulmonary sleep apnea and Continuous Positive Airway Pressure Ventilation ,           Cardiovascular negative cardio ROS       Neuro/Psych negative neurological ROS  negative psych ROS   GI/Hepatic negative GI ROS, Neg liver ROS,   Endo/Other  Hypothyroidism   Renal/GU negative Renal ROS  negative genitourinary   Musculoskeletal  (+) Arthritis ,   Abdominal   Peds negative pediatric ROS (+)  Hematology negative hematology ROS (+)   Anesthesia Other Findings Past Medical History:   Arthritis                                                    Wears glasses                                                Menopause                                       1996         Diffuse cystic mastopathy                       2013         Thyroid disease                                 2011         Mammographic microcalcification                 2011         Special screening for malignant neoplasms, col*              Hyperlipidemia                                  2012         Obesity, unspecified                                         History of IBS                                  2011         Colon polyps  Complication of anesthesia                                     Comment:hard to wake up,memory loss   Hypothyroidism                                               Sleep apnea                                                    Comment:uses a cpap-will bring  dos-will use post op   History of bronchitis                                        Diverticulosis                                               HOH (hard of hearing)                                        Osteoporosis                                                 Reproductive/Obstetrics                             Anesthesia Physical  Anesthesia Plan  ASA: III  Anesthesia Plan: MAC   Post-op Pain Management:    Induction: Intravenous  Airway Management Planned: Nasal Cannula  Additional Equipment:   Intra-op Plan:   Post-operative Plan:   Informed Consent: I have reviewed the patients History and Physical, chart, labs and discussed the procedure including the risks, benefits and alternatives for the proposed anesthesia with the patient or authorized representative who has indicated his/her understanding and acceptance.     Plan Discussed with: CRNA  Anesthesia Plan Comments:         Anesthesia Quick Evaluation

## 2016-02-26 ENCOUNTER — Telehealth: Payer: Self-pay | Admitting: *Deleted

## 2016-02-26 NOTE — Telephone Encounter (Signed)
Patient scheduled to have bone density scan same day as mammogram 03/18/16 at [facility]. Patient aware and verbalized understanding.

## 2016-02-26 NOTE — Telephone Encounter (Signed)
-----   Message from Christene Lye, MD sent at 02/26/2016  8:56 AM EDT ----- OK to order this ----- Message ----- From: Shanon Ace, CMA Sent: 02/24/2016   9:51 AM To: Christene Lye, MD  Dr. Jamal Collin,  Patient called requesting a bone density, would like to know if she can get one ordered when she has her mammogram this month?  Thank you

## 2016-03-19 ENCOUNTER — Encounter: Payer: Self-pay | Admitting: General Surgery

## 2016-03-22 ENCOUNTER — Encounter: Payer: Self-pay | Admitting: General Surgery

## 2016-03-25 ENCOUNTER — Telehealth: Payer: Self-pay | Admitting: *Deleted

## 2016-03-25 NOTE — Telephone Encounter (Signed)
-----   Message from Christene Lye, MD sent at 03/25/2016  2:17 PM EDT ----- Rosann Auerbach, please advise  pt to discuss the report with her PCP

## 2016-03-26 NOTE — Telephone Encounter (Signed)
Patient notified and aware.

## 2016-03-29 ENCOUNTER — Encounter: Payer: Self-pay | Admitting: *Deleted

## 2016-03-31 ENCOUNTER — Ambulatory Visit (INDEPENDENT_AMBULATORY_CARE_PROVIDER_SITE_OTHER): Payer: BLUE CROSS/BLUE SHIELD | Admitting: General Surgery

## 2016-03-31 ENCOUNTER — Encounter: Payer: Self-pay | Admitting: General Surgery

## 2016-03-31 VITALS — BP 122/78 | HR 64 | Resp 12 | Ht 64.0 in | Wt 213.0 lb

## 2016-03-31 DIAGNOSIS — N6019 Diffuse cystic mastopathy of unspecified breast: Secondary | ICD-10-CM | POA: Diagnosis not present

## 2016-03-31 DIAGNOSIS — Z86010 Personal history of colonic polyps: Secondary | ICD-10-CM | POA: Diagnosis not present

## 2016-03-31 NOTE — Patient Instructions (Addendum)
The patient is aware to call back for any questions or concerns.  The patient has been asked to return to the office in one year with a bilateral screening mammogram 

## 2016-03-31 NOTE — Progress Notes (Signed)
Patient ID: TANNETTE CLEMENSON, female   DOB: 1951-03-22, 65 y.o.   MRN: [id number]  Chief Complaint  Patient presents with  . Follow-up    HPI NELIA HOSP is a 65 y.o. female with past history of fibrocystic breast disease, who presents for a breast evaluation. The most recent mammogram and bone density scan was done on 03-18-16. Patient does perform regular self breast checks and gets regular mammograms done.  No new breast issues. Colonoscopy completed last year was normal. I have reviewed the history of present illness with the patient.    HPI  Past Medical History:  Diagnosis Date  . Arthritis   . Colon polyps   . Complication of anesthesia    hard to wake up,memory loss  . Diffuse cystic mastopathy 2013  . Diverticulosis   . History of bronchitis   . History of IBS 2011  . HOH (hard of hearing)   . HOH (hard of hearing)    AIDS  . Hyperlipidemia 2012  . Hypothyroidism   . Mammographic microcalcification 2011  . Menopause 1996  . Obesity, unspecified   . Osteoporosis   . Skin abscess    LEFT CHEEK REMOVED BY DR Kathyrn Sheriff FOR STAPH 4 WEEKS AGO AND ANTIBIOTICS FINISHED  . Sleep apnea    uses a cpap-will bring dos-will use post op  . Special screening for malignant neoplasms, colon   . Thyroid disease 2011  . Wears glasses     Past Surgical History:  Procedure Laterality Date  . BIOPSY THYROID  2009   nodule  . BREAST CYST ASPIRATION Right 2005  . BREAST SURGERY Right October 2011   stereo bx  . CARPAL TUNNEL RELEASE  03/09/2012   Procedure: CARPAL TUNNEL RELEASE;  Surgeon: Cammie Sickle., MD;  Location: [place];  Service: Orthopedics;  Laterality: Right;  . CATARACT EXTRACTION W/PHACO Right 11/20/2015   Procedure: CATARACT EXTRACTION PHACO AND INTRAOCULAR LENS PLACEMENT (IOC);  Surgeon: Eulogio Bear, MD;  Location: [facility];  Service: Ophthalmology;  Laterality: Right;  [place] 1.08 AP% 13.9 CDE 9.50 Fluid pack lot # [id number]  . CATARACT  EXTRACTION W/PHACO Left 02/19/2016   Procedure: CATARACT EXTRACTION PHACO AND INTRAOCULAR LENS PLACEMENT ([place]);  Surgeon: Eulogio Bear, MD;  Location: [facility];  Service: Ophthalmology;  Laterality: Left;  [place] 42.5 AP% 12.8 CDE 5.41 Fluid Pack Lot # [id number]  . COLONOSCOPY  2009   Dr. Vira Agar  . COLONOSCOPY WITH PROPOFOL N/A 05/13/2015   Procedure: COLONOSCOPY WITH PROPOFOL;  Surgeon: Christene Lye, MD;  Location: [facility];  Service: Endoscopy;  Laterality: N/A;  . CYSTOCELE REPAIR    . 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 EYE SURGERY    . FRACTURE SURGERY    . HEEL SPUR SURGERY     both feet  . NOSE SURGERY     growth on nose as child  . ORIF SHOULDER FRACTURE    . PLANTAR FASCIA SURGERY     right  . RECTOCELE REPAIR    . TRIGGER FINGER RELEASE  03/09/2012   Procedure: RELEASE TRIGGER FINGER/A-1 PULLEY;  Surgeon: Cammie Sickle., MD;  Location: [place];  Service: Orthopedics;  Laterality: Right;  a-1 pulley release right ring     Family History  Problem Relation Age of Onset  .  Breast cancer Maternal Grandmother   . Cervical cancer Mother     Social History Social History  Substance Use Topics  . Smoking status: Never Smoker  . Smokeless tobacco: Never Used  . Alcohol use No    Allergies  Allergen Reactions  . Doxycycline     Losses balance  . Tape     Adhesive/white leaves burn marks and blister    Current Outpatient Prescriptions  Medication Sig Dispense Refill  . lovastatin (MEVACOR) 40 MG tablet Take 40 mg by mouth every evening.     . naproxen sodium (ANAPROX) 220 MG tablet Take 220 mg by mouth daily as needed (pain).    . SYNTHROID 50 MCG tablet Take 50 mcg by mouth daily.      No current facility-administered medications for this visit.     Review  of Systems Review of Systems  Constitutional: Negative.   Respiratory: Negative.   Cardiovascular: Negative.     Blood pressure 122/78, pulse 64, resp. rate 12, height 5\' 4"  (1.626 m), weight 213 lb (96.6 kg).  Physical Exam Physical Exam  Constitutional: She is oriented to person, place, and time. She appears well-developed and well-nourished.  Eyes: Conjunctivae are normal. No scleral icterus.  Neck: Neck supple.  Cardiovascular: Normal rate, regular rhythm and normal heart sounds.   Pulmonary/Chest: Effort normal and breath sounds normal. Right breast exhibits no inverted nipple, no mass, no nipple discharge, no skin change and no tenderness. Left breast exhibits no inverted nipple, no mass, no nipple discharge, no skin change and no tenderness.  Abdominal: Soft. Bowel sounds are normal. There is no tenderness.  Lymphadenopathy:    She has no cervical adenopathy.    She has no axillary adenopathy.  Neurological: She is alert and oriented to person, place, and time.  Skin: Skin is warm and dry.  Psychiatric: Her behavior is normal.    Data Reviewed Bone scan and Mammogram reviewed. Both are stable.  Assessment    Stable exam. History of fibrocystic breast disease. Personal history of colon polyps. Last colonoscopy completed in 2016.  Remote family history of breast cancer    Plan    The patient has been asked to return to the office in one year with a bilateral screening mammogram Follow-up with PCP regarding bone density results.     This information has been scribed by Karie Fetch RN, BSN,BC.   Patricie Geeslin G 03/31/2016, 12:03 PM

## 2016-10-20 ENCOUNTER — Ambulatory Visit (INDEPENDENT_AMBULATORY_CARE_PROVIDER_SITE_OTHER): Payer: BLUE CROSS/BLUE SHIELD

## 2016-10-20 ENCOUNTER — Ambulatory Visit (INDEPENDENT_AMBULATORY_CARE_PROVIDER_SITE_OTHER): Payer: BLUE CROSS/BLUE SHIELD | Admitting: Podiatry

## 2016-10-20 DIAGNOSIS — M778 Other enthesopathies, not elsewhere classified: Secondary | ICD-10-CM

## 2016-10-20 DIAGNOSIS — Q828 Other specified congenital malformations of skin: Secondary | ICD-10-CM | POA: Diagnosis not present

## 2016-10-20 DIAGNOSIS — M7752 Other enthesopathy of left foot: Secondary | ICD-10-CM | POA: Diagnosis not present

## 2016-10-20 DIAGNOSIS — M779 Enthesopathy, unspecified: Principal | ICD-10-CM

## 2016-10-20 NOTE — Progress Notes (Signed)
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  Objective: I have reviewed her past history medications allergy surgery social history. Pulses are strongly palpable. She has pain on palpation of the fifth metatarsophalangeal joint plantarly and there appears to be some inflammation beneath the metatarsal head where I'm able to palpate a painful lesion down deep. She also has some superficial porokeratosis.  Assessment: Capsulitis sub-fifth metatarsophalangeal joint left. Porokeratosis of the left.  Plan: Injected the area today with dexamethasone and local anesthetic also debrided all reactive hyperkeratoses.

## 2017-03-30 ENCOUNTER — Encounter: Payer: Self-pay | Admitting: General Surgery

## 2017-04-13 ENCOUNTER — Encounter: Payer: Self-pay | Admitting: General Surgery

## 2017-04-13 ENCOUNTER — Telehealth: Payer: Self-pay | Admitting: General Surgery

## 2017-04-13 ENCOUNTER — Ambulatory Visit: Payer: BLUE CROSS/BLUE SHIELD | Admitting: General Surgery

## 2017-04-13 VITALS — BP 122/64 | HR 66 | Resp 14 | Ht 64.0 in | Wt 215.0 lb

## 2017-04-13 DIAGNOSIS — N6011 Diffuse cystic mastopathy of right breast: Secondary | ICD-10-CM

## 2017-04-13 DIAGNOSIS — Z86010 Personal history of colonic polyps: Secondary | ICD-10-CM

## 2017-04-13 NOTE — Progress Notes (Signed)
Patient ID: Holly Carroll, female   DOB: 23-Jul-1950, 66 y.o.   MRN: [id number]  Chief Complaint  Patient presents with  . Follow-up    HPI Holly Carroll is a 66 y.o. female.  who presents for a breast evaluation. The most recent mammogram was done on 03-30-17.  Patient does perform regular self breast checks and gets regular mammograms done.     HPI  Past Medical History:  Diagnosis Date  . Arthritis   . Colon polyps   . Complication of anesthesia    hard to wake up,memory loss  . Diffuse cystic mastopathy 2013  . Diverticulosis   . History of bronchitis   . History of IBS 2011  . HOH (hard of hearing)   . HOH (hard of hearing)    AIDS  . Hyperlipidemia 2012  . Hypothyroidism   . Mammographic microcalcification 2011  . Menopause 1996  . Obesity, unspecified   . Osteoporosis   . Skin abscess    LEFT CHEEK REMOVED BY DR Kathyrn Sheriff FOR STAPH 4 WEEKS AGO AND ANTIBIOTICS FINISHED  . Sleep apnea    uses a cpap-will bring dos-will use post op  . Special screening for malignant neoplasms, colon   . Thyroid disease 2011  . Wears glasses     Past Surgical History:  Procedure Laterality Date  . BIOPSY THYROID  2009   nodule  . BREAST CYST ASPIRATION Right 2005  . BREAST SURGERY Right October 2011   stereo bx  . CARPAL TUNNEL RELEASE  03/09/2012   Procedure: CARPAL TUNNEL RELEASE;  Surgeon: Cammie Sickle., MD;  Location: [place];  Service: Orthopedics;  Laterality: Right;  . CATARACT EXTRACTION W/PHACO Right 11/20/2015   Procedure: CATARACT EXTRACTION PHACO AND INTRAOCULAR LENS PLACEMENT (IOC);  Surgeon: Eulogio Bear, MD;  Location: [facility];  Service: Ophthalmology;  Laterality: Right;  [place] 1.08 AP% 13.9 CDE 9.50 Fluid pack lot # [id number]  . CATARACT EXTRACTION W/PHACO Left 02/19/2016   Procedure: CATARACT EXTRACTION PHACO AND INTRAOCULAR LENS PLACEMENT ([place]);  Surgeon: Eulogio Bear, MD;  Location: [facility];  Service: Ophthalmology;  Laterality:  Left;  [place] 42.5 AP% 12.8 CDE 5.41 Fluid Pack Lot # [id number]  . COLONOSCOPY  2009   Dr. Vira Agar  . COLONOSCOPY WITH PROPOFOL N/A 05/13/2015   Procedure: COLONOSCOPY WITH PROPOFOL;  Surgeon: Christene Lye, MD;  Location: [facility];  Service: Endoscopy;  Laterality: N/A;  . CYSTOCELE REPAIR    . DUPUYTREN CONTRACTURE RELEASE  03/09/2012   Procedure: DUPUYTREN CONTRACTURE RELEASE;  Surgeon: Cammie Sickle., MD;  Location: [place];  Service: Orthopedics;  Laterality: Right;  . EYE SURGERY    . FRACTURE SURGERY    . HEEL SPUR SURGERY     both feet  . NOSE SURGERY     growth on nose as child  . ORIF SHOULDER FRACTURE    . PLANTAR FASCIA SURGERY     right  . RECTOCELE REPAIR    . TRIGGER FINGER RELEASE  03/09/2012   Procedure: RELEASE TRIGGER FINGER/A-1 PULLEY;  Surgeon: Cammie Sickle., MD;  Location: [place];  Service: Orthopedics;  Laterality: Right;  a-1 pulley release right ring     Family History  Problem Relation Age of Onset  . Breast cancer Maternal Grandmother   . Cervical cancer Mother     Social History Social History   Tobacco Use  . Smoking status: Never Smoker  .  Smokeless tobacco: Never Used  Substance Use Topics  . Alcohol use: No  . Drug use: No    Allergies  Allergen Reactions  . Doxycycline     Losses balance  . Tape     Adhesive/white leaves burn marks and blister    Current Outpatient Medications  Medication Sig Dispense Refill  . Cholecalciferol (VITAMIN D-1000 MAX ST) 1000 units tablet Take by mouth.    . levothyroxine (SYNTHROID, LEVOTHROID) 50 MCG tablet Take by mouth.    . lovastatin (MEVACOR) 40 MG tablet Take 40 mg by mouth every evening.     . naproxen sodium (ANAPROX) 220 MG tablet Take 220 mg by mouth daily as needed (pain).     No current facility-administered medications for this visit.     Review of Systems Review of Systems  Blood pressure 122/64, pulse 66, resp. rate 14,  height 5\' 4"  (1.626 m), weight 215 lb (97.5 kg).  Physical Exam Physical Exam  Constitutional: She is oriented to person, place, and time. She appears well-developed and well-nourished.  Eyes: Conjunctivae are normal. No scleral icterus.  Neck: Neck supple.  Cardiovascular: Normal rate, regular rhythm and normal heart sounds.  Pulmonary/Chest: Effort normal and breath sounds normal. Right breast exhibits tenderness (chronic tenderness). Right breast exhibits no inverted nipple, no mass, no nipple discharge and no skin change. Left breast exhibits tenderness (chronic tenderness). Left breast exhibits no inverted nipple, no mass, no nipple discharge and no skin change.  Lymphadenopathy:    She has no cervical adenopathy.    She has no axillary adenopathy.  Neurological: She is alert and oriented to person, place, and time.  Skin: Skin is warm and dry.    Data Reviewed Mammogram reviewed    Assessment    History of fibrocystic breast disease. Stable exam and mammogram  Remote family history of breast cancer  History of colon polyp    Plan    The patient has been asked to return to her PCP in one year with a bilateral screening mammogram. The patient is aware to call back for any questions or concerns. Colonoscopy due in 2021 with Dr. Bary Castilla.     HPI, Physical Exam, Assessment and Plan have been scribed under the direction and in the presence of Hervey Ard, MD.  Gaspar Cola, CMA      I have completed the exam and reviewed the above documentation for accuracy and completeness.  I agree with the above.  Haematologist has been used and any errors in dictation or transcription are unintentional.  Caeli Linehan G. Jamal Collin, M.D., F.A.C.S.   Junie Panning G 04/18/2017, 4:29 PM

## 2017-04-13 NOTE — Telephone Encounter (Signed)
PATIENT WAS GIVEN THE NAME OF [facility] FOR HER FURTHER APPOINTMENTS.THEY NEED A REFERRAL FROM YOU IN ORDER TO MAKE HER AN APPOINTMENT.

## 2017-04-13 NOTE — Patient Instructions (Addendum)
The patient has been asked to return to her PCP in one year with a bilateral screening mammogram. The patient is aware to call back for any questions or concerns. Colonoscopy done in 2021.

## 2017-04-27 ENCOUNTER — Encounter: Payer: Self-pay | Admitting: Podiatry

## 2017-04-27 ENCOUNTER — Ambulatory Visit: Payer: BLUE CROSS/BLUE SHIELD | Admitting: Podiatry

## 2017-04-27 DIAGNOSIS — M7752 Other enthesopathy of left foot: Secondary | ICD-10-CM | POA: Diagnosis not present

## 2017-04-27 DIAGNOSIS — Q828 Other specified congenital malformations of skin: Secondary | ICD-10-CM | POA: Diagnosis not present

## 2017-04-27 DIAGNOSIS — M778 Other enthesopathies, not elsewhere classified: Secondary | ICD-10-CM

## 2017-04-27 DIAGNOSIS — M779 Enthesopathy, unspecified: Principal | ICD-10-CM

## 2017-04-27 NOTE — Progress Notes (Signed)
She presents today with a chief complaint of a painful lesion sub-fifth metatarsal head of the left foot.  She states it is possibility that her shoes that she wears for work has irritated the area once again.  She was last here May 30 of 2018 with similar findings.  Objective: Vital signs are stable she is alert and oriented x3.  Pulses are palpable.  He has pain overlying the fifth metatarsal head Plantar and plantar lateral aspect.  Porokeratotic lesion is noted.  She also has pain on range of motion of the fifth metatarsal phalangeal joint with mild tailor's bunion deformity.  Assessment: Capsulitis of the fifth metatarsal phalangeal joint bursitis and overlying reactive hyperkeratotic lesion or porokeratotic lesion.  Fifth metatarsal head left foot.  Plan: Discussed etiology pathology conservative versus surgical therapies.  At this point I injected the fifth metatarsal phalangeal joint plantarly with 4 mg of dexamethasone and 2-1/2 mg of Marcaine after sterile Betadine skin prep and verbal consent.  Tolerated procedure well without complications.  I then debrided the area of reactive hyperkeratosis placed padding and a Band-Aid discussed appropriate shoe gear stretching exercises and ice therapy follow-up with her on an as-needed basis.

## 2017-09-07 ENCOUNTER — Ambulatory Visit
Admission: EM | Admit: 2017-09-07 | Discharge: 2017-09-07 | Disposition: A | Discharge status: Home / Self Care | Payer: BLUE CROSS/BLUE SHIELD | Attending: Family Medicine | Admitting: Family Medicine

## 2017-09-07 ENCOUNTER — Other Ambulatory Visit: Payer: Self-pay

## 2017-09-07 DIAGNOSIS — M6530 Trigger finger, unspecified finger: Secondary | ICD-10-CM

## 2017-09-07 DIAGNOSIS — M65332 Trigger finger, left middle finger: Secondary | ICD-10-CM

## 2017-09-07 NOTE — ED Triage Notes (Signed)
Patient complains of left middle finger pain. Patient states that she was picking up paper and finger has frozen in place. Patient states that she is unable to open finger. Patient reports that area is painful. Patient denies injury to the area.

## 2017-09-07 NOTE — Discharge Instructions (Signed)
Call hand surgery.  Take care  Dr. Lacinda Axon

## 2017-09-07 NOTE — ED Provider Notes (Signed)
[facility]  CSN: [id number] Arrival date & time: 09/07/17  1211  History   Chief Complaint Chief Complaint  Patient presents with  . Finger Injury    Left middle finger   HPI  67 year old female presents with the above complaint.  Patient has Dupuytren's contracture.  Patient states that this morning she was picking up some papers.  Suddenly her left middle finger got stuck into place.  It is currently stuck in a flexed position.  She states that her pain is severe.  She is unable to straighten her finger.  No fall, trauma, injury.  Patient does have a Copy.  She is attempted to straighten her finger but has been unable to do so secondary to pain.  No known exacerbating factors.  No other associated symptoms.  No other complaints.  Past Medical History:  Diagnosis Date  . Arthritis   . Colon polyps   . Complication of anesthesia    hard to wake up,memory loss  . Diffuse cystic mastopathy 2013  . Diverticulosis   . History of bronchitis   . History of IBS 2011  . HOH (hard of hearing)   . HOH (hard of hearing)    AIDS  . Hyperlipidemia 2012  . Hypothyroidism   . Mammographic microcalcification 2011  . Menopause 1996  . Obesity, unspecified   . Osteoporosis   . Skin abscess    LEFT CHEEK REMOVED BY DR Kathyrn Sheriff FOR STAPH 4 WEEKS AGO AND ANTIBIOTICS FINISHED  . Sleep apnea    uses a cpap-will bring dos-will use post op  . Special screening for malignant neoplasms, colon   . Thyroid disease 2011  . Wears glasses     Patient Active Problem List   Diagnosis Date Noted  . History of colonic polyps 03/25/2015  . Fibrocystic breast 03/15/2013    Past Surgical History:  Procedure Laterality Date  . BIOPSY THYROID  2009   nodule  . BREAST CYST ASPIRATION Right 2005  . BREAST SURGERY Right October 2011   stereo bx  . CARPAL TUNNEL RELEASE  03/09/2012   Procedure: CARPAL TUNNEL RELEASE;  Surgeon: Cammie Sickle., MD;  Location: [place];  Service: Orthopedics;  Laterality: Right;  . CATARACT EXTRACTION W/PHACO Right 11/20/2015   Procedure: CATARACT EXTRACTION PHACO AND INTRAOCULAR LENS PLACEMENT (I[facility]);  Surgeon: Eulogio Bear, MD;  Location: [facility];  Service: Ophthalmology;  Laterality: Right;  [place] 1.08 AP% 13.9 CDE 9.50 Fluid pack lot # [id number]  . CATARACT EXTRACTION W/PHACO Left 02/19/2016   Procedure: CATARACT EXTRACTION PHACO AND INTRAOCULAR LENS PLACEMENT ([place]);  Surgeon: Eulogio Bear, MD;  Location: [facility];  Service: Ophthalmology;  Laterality: Left;  [place] 42.5 AP% 12.8 CDE 5.41 Fluid Pack Lot # [id number]  . COLONOSCOPY  2009   Dr. Vira Agar  . COLONOSCOPY WITH PROPOFOL N/A 05/13/2015   Procedure: COLONOSCOPY WITH PROPOFOL;  Surgeon: Christene Lye, MD;  Location: [facility];  Service: Endoscopy;  Laterality: N/A;  . CYSTOCELE REPAIR    . DUPUYTREN CONTRACTURE RELEASE  03/09/2012   Procedure: DUPUYTREN CONTRACTURE RELEASE;  Surgeon: Cammie Sickle., MD;  Location: [place];  Service: Orthopedics;  Laterality: Right;  . EYE SURGERY    . FRACTURE SURGERY    . HEEL SPUR SURGERY     both feet  . NOSE SURGERY     growth on nose as child  . ORIF SHOULDER FRACTURE    . PLANTAR FASCIA  SURGERY     right  . RECTOCELE REPAIR    . TRIGGER FINGER RELEASE  03/09/2012   Procedure: RELEASE TRIGGER FINGER/A-1 PULLEY;  Surgeon: Cammie Sickle., MD;  Location: [place];  Service: Orthopedics;  Laterality: Right;  a-1 pulley release right ring     OB History    Gravida  0   Para      Term      Preterm      AB      Living        SAB      TAB      Ectopic      Multiple      Live Births           Obstetric Comments  Age with first menstruation-12         Home Medications    Prior to Admission medications   Medication Sig Start Date End Date Taking? Authorizing Provider  Cholecalciferol (VITAMIN D-1000 MAX ST) 1000 units tablet Take  by mouth.   Yes [provider]  levothyroxine (SYNTHROID, LEVOTHROID) 50 MCG tablet Take by mouth. 02/27/14  Yes [provider]  lovastatin (MEVACOR) 40 MG tablet Take 40 mg by mouth every evening.  11/05/14  Yes [provider]  naproxen sodium (ANAPROX) 220 MG tablet Take 220 mg by mouth daily as needed (pain).   Yes [provider]    Family History Family History  Problem Relation Age of Onset  . Breast cancer Maternal Grandmother   . Cervical cancer Mother     Social History Social History   Tobacco Use  . Smoking status: Never Smoker  . Smokeless tobacco: Never Used  Substance Use Topics  . Alcohol use: No  . Drug use: No     Allergies   Doxycycline and Tape   Review of Systems Review of Systems  Constitutional: Negative.   Musculoskeletal:       Left middle finger "frozen". Pain.   Physical Exam Triage Vital Signs ED Triage Vitals  Enc Vitals Group     BP 09/07/17 1225 (!) 146/91     Pulse Rate 09/07/17 1225 67     Resp 09/07/17 1225 18     Temp 09/07/17 1225 98.4 F (36.9 C)     Temp Source 09/07/17 1225 Oral     SpO2 09/07/17 1225 97 %     Weight 09/07/17 1225 215 lb (97.5 kg)     Height 09/07/17 1225 5\' 4"  (1.626 m)     Head Circumference --      Peak Flow --      Pain Score 09/07/17 1224 5     Pain Loc --      Pain Edu? --      Excl. in [place]? --    No data found.  Updated Vital Signs BP (!) 146/91 (BP Location: Left Arm)   Pulse 67   Temp 98.4 F (36.9 C) (Oral)   Resp 18   Ht 5\' 4"  (1.626 m)   Wt 215 lb (97.5 kg)   SpO2 97%   BMI 36.90 kg/m   Physical Exam  Constitutional: She is oriented to person, place, and time. She appears well-developed. No distress.  Cardiovascular: Normal rate and regular rhythm.  Pulmonary/Chest: Effort normal and breath sounds normal. She has no wheezes. She has no rales.  Musculoskeletal:  Left middle finger flexed at the PIP and DIP joints.  Appearance contracture  noted.  Patient has exquisite tenderness to palpation of all areas of her finger  Neurological: She is alert and oriented to person, place, and time.  Psychiatric: She has a normal mood and affect. Her behavior is normal.  Nursing note and vitals reviewed.  UC Treatments / Results  Labs (all labs ordered are listed, but only abnormal results are displayed) Labs Reviewed - No data to display  EKG None Radiology No results found.  Procedures Procedures (including critical care time)  Medications Ordered in UC Medications - No data to display   Initial Impression / Assessment and Plan / UC Course  I have reviewed the triage vital signs and the nursing notes.  Pertinent labs & imaging results that were available during my care of the patient were reviewed by me and considered in my medical decision making (see chart for details).     67 year old female presents with a trigger finger.  With the aid of Tamala Ser, PA-C we were able to extend her finger outward.  Patient was placed in a splint to prevent recurrence.  Advised to contact her hand surgeon.  Final Clinical Impressions(s) / UC Diagnoses   Final diagnoses:  Trigger finger, acquired    ED Discharge Orders    None     Controlled Substance Prescriptions [place] Controlled Substance Registry consulted? Not Applicable   Coral Spikes, DO 09/07/17 1343

## 2017-11-16 ENCOUNTER — Other Ambulatory Visit: Payer: Self-pay | Admitting: Nurse Practitioner

## 2017-11-16 DIAGNOSIS — Z1231 Encounter for screening mammogram for malignant neoplasm of breast: Secondary | ICD-10-CM

## 2017-12-07 ENCOUNTER — Encounter: Payer: Self-pay | Admitting: Podiatry

## 2017-12-07 ENCOUNTER — Ambulatory Visit: Payer: BLUE CROSS/BLUE SHIELD | Admitting: Podiatry

## 2017-12-07 DIAGNOSIS — Q828 Other specified congenital malformations of skin: Secondary | ICD-10-CM | POA: Diagnosis not present

## 2017-12-07 NOTE — Progress Notes (Signed)
She presents today states that the scar on the bottom of her right foot is painful and she pulled a piece of skin off of it and started bleeding.  Objective: Vital signs are stable alert and oriented x3.  Pulses are palpable.  Small area of laceration measuring less than 4 mm in diameter no purulence no malodor.  Left foot demonstrates a reactive hyperkeratotic lesion sub-fifth metatarsal phalangeal joint left which she would like to have trimmed.  Assessment: Painful open lesion right foot.  Porokeratosis left foot.  Plan: Redressed the right foot daily after soaking Epsom salts and warm water also debrided reactive hyperkeratosis for her today.

## 2018-02-08 DIAGNOSIS — G5602 Carpal tunnel syndrome, left upper limb: Secondary | ICD-10-CM | POA: Insufficient documentation

## 2018-02-08 DIAGNOSIS — M65332 Trigger finger, left middle finger: Secondary | ICD-10-CM | POA: Insufficient documentation

## 2018-03-23 ENCOUNTER — Other Ambulatory Visit: Payer: Self-pay

## 2018-03-23 ENCOUNTER — Other Ambulatory Visit: Payer: Self-pay | Admitting: Orthopedic Surgery

## 2018-03-23 ENCOUNTER — Encounter (HOSPITAL_BASED_OUTPATIENT_CLINIC_OR_DEPARTMENT_OTHER): Payer: Self-pay | Admitting: *Deleted

## 2018-03-27 ENCOUNTER — Encounter (HOSPITAL_BASED_OUTPATIENT_CLINIC_OR_DEPARTMENT_OTHER)
Admission: RE | Disposition: A | Discharge status: Home / Self Care | Payer: Self-pay | Source: Ambulatory Visit | Attending: Orthopedic Surgery

## 2018-03-27 ENCOUNTER — Encounter (HOSPITAL_BASED_OUTPATIENT_CLINIC_OR_DEPARTMENT_OTHER): Payer: Self-pay | Admitting: Anesthesiology

## 2018-03-27 ENCOUNTER — Other Ambulatory Visit: Payer: Self-pay

## 2018-03-27 ENCOUNTER — Ambulatory Visit (HOSPITAL_BASED_OUTPATIENT_CLINIC_OR_DEPARTMENT_OTHER): Payer: BLUE CROSS/BLUE SHIELD | Admitting: Certified Registered"

## 2018-03-27 ENCOUNTER — Ambulatory Visit (HOSPITAL_BASED_OUTPATIENT_CLINIC_OR_DEPARTMENT_OTHER)
Admission: RE | Admit: 2018-03-27 | Discharge: 2018-03-27 | Disposition: A | Discharge status: Home / Self Care | Payer: BLUE CROSS/BLUE SHIELD | Source: Ambulatory Visit | Attending: Orthopedic Surgery | Admitting: Orthopedic Surgery

## 2018-03-27 DIAGNOSIS — E785 Hyperlipidemia, unspecified: Secondary | ICD-10-CM | POA: Insufficient documentation

## 2018-03-27 DIAGNOSIS — E039 Hypothyroidism, unspecified: Secondary | ICD-10-CM | POA: Diagnosis not present

## 2018-03-27 DIAGNOSIS — Z9989 Dependence on other enabling machines and devices: Secondary | ICD-10-CM | POA: Diagnosis not present

## 2018-03-27 DIAGNOSIS — Z79890 Hormone replacement therapy (postmenopausal): Secondary | ICD-10-CM | POA: Diagnosis not present

## 2018-03-27 DIAGNOSIS — G4730 Sleep apnea, unspecified: Secondary | ICD-10-CM | POA: Insufficient documentation

## 2018-03-27 DIAGNOSIS — Z79899 Other long term (current) drug therapy: Secondary | ICD-10-CM | POA: Diagnosis not present

## 2018-03-27 DIAGNOSIS — M65332 Trigger finger, left middle finger: Secondary | ICD-10-CM | POA: Diagnosis not present

## 2018-03-27 HISTORY — PX: TRIGGER FINGER RELEASE: SHX641

## 2018-03-27 SURGERY — RELEASE, A1 PULLEY, FOR TRIGGER FINGER
Anesthesia: Regional | Site: Hand | Laterality: Left

## 2018-03-27 MED ORDER — MIDAZOLAM HCL 2 MG/2ML IJ SOLN: 1.0000 mg | INTRAMUSCULAR | Status: DC | PRN

## 2018-03-27 MED ORDER — PROPOFOL 500 MG/50ML IV EMUL
INTRAVENOUS | Status: DC | PRN
  Administered 2018-03-27: 50 ug/kg/min via INTRAVENOUS

## 2018-03-27 MED ORDER — CEFAZOLIN SODIUM-DEXTROSE 2-4 GM/100ML-% IV SOLN
2.0000 g | INTRAVENOUS | Status: AC
  Administered 2018-03-27: 2 g via INTRAVENOUS

## 2018-03-27 MED ORDER — FENTANYL CITRATE (PF) 100 MCG/2ML IJ SOLN
INTRAMUSCULAR | Status: AC
  Filled 2018-03-27: qty 2

## 2018-03-27 MED ORDER — HYDROCODONE-ACETAMINOPHEN 5-325 MG PO TABS: ORAL_TABLET | ORAL | 0 refills | Status: DC

## 2018-03-27 MED ORDER — HYDROMORPHONE HCL 1 MG/ML IJ SOLN: 0.2500 mg | INTRAMUSCULAR | Status: DC | PRN

## 2018-03-27 MED ORDER — OXYCODONE HCL 5 MG PO TABS: 5.0000 mg | ORAL_TABLET | Freq: Once | ORAL | Status: DC | PRN

## 2018-03-27 MED ORDER — LIDOCAINE HCL (CARDIAC) PF 100 MG/5ML IV SOSY
PREFILLED_SYRINGE | INTRAVENOUS | Status: DC | PRN
  Administered 2018-03-27: 30 mg via INTRAVENOUS

## 2018-03-27 MED ORDER — LACTATED RINGERS IV SOLN
INTRAVENOUS | Status: DC
  Administered 2018-03-27: 12:00:00 via INTRAVENOUS

## 2018-03-27 MED ORDER — BUPIVACAINE HCL (PF) 0.25 % IJ SOLN
INTRAMUSCULAR | Status: AC
  Filled 2018-03-27: qty 30

## 2018-03-27 MED ORDER — OXYCODONE HCL 5 MG/5ML PO SOLN: 5.0000 mg | Freq: Once | ORAL | Status: DC | PRN

## 2018-03-27 MED ORDER — LACTATED RINGERS IV SOLN: INTRAVENOUS | Status: DC

## 2018-03-27 MED ORDER — MEPERIDINE HCL 25 MG/ML IJ SOLN: 6.2500 mg | INTRAMUSCULAR | Status: DC | PRN

## 2018-03-27 MED ORDER — PROMETHAZINE HCL 25 MG/ML IJ SOLN: 6.2500 mg | INTRAMUSCULAR | Status: DC | PRN

## 2018-03-27 MED ORDER — BUPIVACAINE HCL (PF) 0.25 % IJ SOLN
INTRAMUSCULAR | Status: DC | PRN
  Administered 2018-03-27: 8 mL

## 2018-03-27 MED ORDER — SCOPOLAMINE 1 MG/3DAYS TD PT72: 1.0000 | MEDICATED_PATCH | Freq: Once | TRANSDERMAL | Status: DC | PRN

## 2018-03-27 MED ORDER — CHLORHEXIDINE GLUCONATE 4 % EX LIQD: 60.0000 mL | Freq: Once | CUTANEOUS | Status: DC

## 2018-03-27 MED ORDER — FENTANYL CITRATE (PF) 100 MCG/2ML IJ SOLN
50.0000 ug | INTRAMUSCULAR | Status: DC | PRN
  Administered 2018-03-27: 100 ug via INTRAVENOUS

## 2018-03-27 MED ORDER — LIDOCAINE HCL (PF) 0.5 % IJ SOLN
INTRAMUSCULAR | Status: DC | PRN
  Administered 2018-03-27: 30 mL via INTRAVENOUS

## 2018-03-27 MED ORDER — CEFAZOLIN SODIUM-DEXTROSE 2-4 GM/100ML-% IV SOLN
INTRAVENOUS | Status: AC
  Filled 2018-03-27: qty 100

## 2018-03-27 SURGICAL SUPPLY — 33 items
BANDAGE COBAN STERILE 2 (GAUZE/BANDAGES/DRESSINGS) ×3 IMPLANT
BLADE SURG 15 STRL LF DISP TIS (BLADE) ×2 IMPLANT
BLADE SURG 15 STRL SS (BLADE) ×4
BNDG ESMARK 4X9 LF (GAUZE/BANDAGES/DRESSINGS) IMPLANT
CHLORAPREP W/TINT 26ML (MISCELLANEOUS) ×3 IMPLANT
CORD BIPOLAR FORCEPS 12FT (ELECTRODE) ×3 IMPLANT
COVER BACK TABLE 60X90IN (DRAPES) ×3 IMPLANT
COVER MAYO STAND STRL (DRAPES) ×3 IMPLANT
COVER WAND RF STERILE (DRAPES) IMPLANT
CUFF TOURNIQUET SINGLE 18IN (TOURNIQUET CUFF) ×3 IMPLANT
DRAPE EXTREMITY T 121X128X90 (DRAPE) ×3 IMPLANT
DRAPE SURG 17X23 STRL (DRAPES) ×3 IMPLANT
GAUZE SPONGE 4X4 12PLY STRL (GAUZE/BANDAGES/DRESSINGS) ×3 IMPLANT
GAUZE XEROFORM 1X8 LF (GAUZE/BANDAGES/DRESSINGS) ×3 IMPLANT
GLOVE BIO SURGEON STRL SZ7.5 (GLOVE) ×3 IMPLANT
GLOVE BIOGEL PI IND STRL 7.0 (GLOVE) ×1 IMPLANT
GLOVE BIOGEL PI IND STRL 8 (GLOVE) ×1 IMPLANT
GLOVE BIOGEL PI INDICATOR 7.0 (GLOVE) ×2
GLOVE BIOGEL PI INDICATOR 8 (GLOVE) ×2
GLOVE NEODERM STRL 7.5 LF PF (GLOVE) ×1 IMPLANT
GLOVE SURG NEODERM 7.5  LF PF (GLOVE) ×2
GOWN STRL REUS W/ TWL LRG LVL3 (GOWN DISPOSABLE) ×1 IMPLANT
GOWN STRL REUS W/TWL LRG LVL3 (GOWN DISPOSABLE) ×2
GOWN STRL REUS W/TWL XL LVL3 (GOWN DISPOSABLE) ×3 IMPLANT
NEEDLE HYPO 25X1 1.5 SAFETY (NEEDLE) ×3 IMPLANT
NS IRRIG 1000ML POUR BTL (IV SOLUTION) ×3 IMPLANT
PACK BASIN DAY SURGERY FS (CUSTOM PROCEDURE TRAY) ×3 IMPLANT
STOCKINETTE 4X48 STRL (DRAPES) ×3 IMPLANT
SUT ETHILON 4 0 PS 2 18 (SUTURE) ×3 IMPLANT
SYR BULB 3OZ (MISCELLANEOUS) ×3 IMPLANT
SYR CONTROL 10ML LL (SYRINGE) ×3 IMPLANT
TOWEL GREEN STERILE FF (TOWEL DISPOSABLE) ×6 IMPLANT
UNDERPAD 30X30 (UNDERPADS AND DIAPERS) ×3 IMPLANT

## 2018-03-27 NOTE — Transfer of Care (Signed)
Immediate Anesthesia Transfer of Care Note  Patient: Holly Carroll  Procedure(s) Performed: RELEASE TRIGGER LEFT LONG FINGER (Left Hand)  Patient Location: PACU  Anesthesia Type:Bier block  Level of Consciousness: awake, alert , oriented and patient cooperative  Airway & Oxygen Therapy: Patient Spontanous Breathing and Patient connected to face mask oxygen  Post-op Assessment: Report given to RN and Post -op Vital signs reviewed and stable  Post vital signs: Reviewed and stable  Last Vitals:  Vitals Value Taken Time  BP    Temp    Pulse 63 03/27/2018  1:57 PM  Resp 13 03/27/2018  1:57 PM  SpO2 99 % 03/27/2018  1:57 PM  Vitals shown include unvalidated device data.  Last Pain:  Vitals:   03/27/18 1145  TempSrc: Oral  PainSc: 1       Patients Stated Pain Goal: 1 (78/47/84 1282)  Complications: No apparent anesthesia complications

## 2018-03-27 NOTE — H&P (Signed)
Holly Carroll is an 67 y.o. female.   Chief Complaint: left long finger trigger digit HPI: 67 yo female with triggering left long finger.  This has been injected without lasting relief.  She wishes to have a trigger release.  Allergies:  Allergies  Allergen Reactions  . Doxycycline     Losses balance  . Tape     Adhesive/white leaves burn marks and blister    Past Medical History:  Diagnosis Date  . Arthritis   . Colon polyps   . Complication of anesthesia    hard to wake up,memory loss  . Diffuse cystic mastopathy 2013  . Diverticulosis   . History of bronchitis   . History of IBS 2011  . HOH (hard of hearing)   . HOH (hard of hearing)    AIDS  . Hyperlipidemia 2012  . Hypothyroidism   . Mammographic microcalcification 2011  . Menopause 1996  . Obesity, unspecified   . Osteoporosis   . Skin abscess    LEFT CHEEK REMOVED BY DR Kathyrn Sheriff FOR STAPH 4 WEEKS AGO AND ANTIBIOTICS FINISHED  . Sleep apnea    uses a cpap-will bring dos-will use post op  . Special screening for malignant neoplasms, colon   . Thyroid disease 2011  . Wears glasses     Past Surgical History:  Procedure Laterality Date  . BIOPSY THYROID  2009   nodule  . BREAST CYST ASPIRATION Right 2005  . BREAST SURGERY Right October 2011   stereo bx  . CARPAL TUNNEL RELEASE  03/09/2012   Procedure: CARPAL TUNNEL RELEASE;  Surgeon: Cammie Sickle., MD;  Location: [place];  Service: Orthopedics;  Laterality: Right;  . CATARACT EXTRACTION W/PHACO Right 11/20/2015   Procedure: CATARACT EXTRACTION PHACO AND INTRAOCULAR LENS PLACEMENT ([facility]);  Surgeon: Eulogio Bear, MD;  Location: [facility];  Service: Ophthalmology;  Laterality: Right;  [place] 1.08 AP% 13.9 CDE 9.50 Fluid pack lot # [id number]  . CATARACT EXTRACTION W/PHACO Left 02/19/2016   Procedure: CATARACT EXTRACTION PHACO AND INTRAOCULAR LENS PLACEMENT ([place]);  Surgeon: Eulogio Bear, MD;  Location: [facility];  Service: Ophthalmology;   Laterality: Left;  [place] 42.5 AP% 12.8 CDE 5.41 Fluid Pack Lot # [id number]  . COLONOSCOPY  2009   Dr. Vira Agar  . COLONOSCOPY WITH PROPOFOL N/A 05/13/2015   Procedure: COLONOSCOPY WITH PROPOFOL;  Surgeon: Christene Lye, MD;  Location: [facility];  Service: Endoscopy;  Laterality: N/A;  . CYSTOCELE REPAIR    . DUPUYTREN CONTRACTURE RELEASE  03/09/2012   Procedure: DUPUYTREN CONTRACTURE RELEASE;  Surgeon: Cammie Sickle., MD;  Location: [place];  Service: Orthopedics;  Laterality: Right;  . EYE SURGERY    . FRACTURE SURGERY    . HEEL SPUR SURGERY     both feet  . NOSE SURGERY     growth on nose as child  . ORIF SHOULDER FRACTURE    . PLANTAR FASCIA SURGERY     right  . RECTOCELE REPAIR    . TRIGGER FINGER RELEASE  03/09/2012   Procedure: RELEASE TRIGGER FINGER/A-1 PULLEY;  Surgeon: Cammie Sickle., MD;  Location: [place];  Service: Orthopedics;  Laterality: Right;  a-1 pulley release right ring     Family History: Family History  Problem Relation Age of Onset  . Breast cancer Maternal Grandmother   . Cervical cancer Mother     Social History:   reports that she has never smoked. She has  never used smokeless tobacco. She reports that she does not drink alcohol or use drugs.  Medications: Medications Prior to Admission  Medication Sig Dispense Refill  . Cholecalciferol (VITAMIN D-1000 MAX ST) 1000 units tablet Take by mouth.    . levothyroxine (SYNTHROID, LEVOTHROID) 50 MCG tablet Take by mouth.    . naproxen sodium (ANAPROX) 220 MG tablet Take 220 mg by mouth daily as needed (pain).    . rosuvastatin (CRESTOR) 10 MG tablet       No results found for this or any previous visit (from the past 48 hour(s)).  No results found.   A comprehensive review of systems was negative.  Blood pressure (!) 145/62, pulse 65, temperature 97.9 F (36.6 C), temperature source Oral, resp. rate 18, height 5\' 4"  (1.626 m), weight 97.4 kg,  SpO2 100 %.  General appearance: alert, cooperative and appears stated age Head: Normocephalic, without obvious abnormality, atraumatic Neck: supple, symmetrical, trachea midline Cardio: regular rate and rhythm Resp: clear to auscultation bilaterally Extremities: Intact sensation and capillary refill all digits.  +epl/fpl/io.  No wounds.  Pulses: 2+ and symmetric Skin: Skin color, texture, turgor normal. No rashes or lesions Neurologic: Grossly normal Incision/Wound: none  Assessment/Plan Left long finger trigger digit.  Non operative and operative treatment options have been discussed with the patient and patient wishes to proceed with operative treatment. Risks, benefits, and alternatives of surgery have been discussed and the patient agrees with the plan of care.   Holly Carroll 03/27/2018, 1:20 PM

## 2018-03-27 NOTE — Anesthesia Preprocedure Evaluation (Addendum)
Anesthesia Evaluation  Patient identified by MRN, date of birth, ID band Patient awake    Reviewed: Allergy & Precautions, NPO status , Patient's Chart, lab work & pertinent test results  Airway Mallampati: I     Mouth opening: Limited Mouth Opening  Dental  (+) Teeth Intact, Dental Advisory Given   Pulmonary sleep apnea and Continuous Positive Airway Pressure Ventilation ,    breath sounds clear to auscultation       Cardiovascular negative cardio ROS   Rhythm:Regular Rate:Normal     Neuro/Psych    GI/Hepatic negative GI ROS, Neg liver ROS,   Endo/Other  Hypothyroidism   Renal/GU negative Renal ROS     Musculoskeletal  (+) Arthritis ,   Abdominal (+) + obese,   Peds  Hematology negative hematology ROS (+)   Anesthesia Other Findings - HLD  Reproductive/Obstetrics                            Anesthesia Physical Anesthesia Plan  ASA: II  Anesthesia Plan: Bier Block and Bier Block-LIDOCAINE ONLY   Post-op Pain Management:    Induction: Intravenous  PONV Risk Score and Plan: 4 or greater and Ondansetron, Dexamethasone, Midazolam, Treatment may vary due to age or medical condition and Propofol infusion  Airway Management Planned: Simple Face Mask  Additional Equipment: None  Intra-op Plan:   Post-operative Plan:   Informed Consent: I have reviewed the patients History and Physical, chart, labs and discussed the procedure including the risks, benefits and alternatives for the proposed anesthesia with the patient or authorized representative who has indicated his/her understanding and acceptance.     Plan Discussed with: CRNA  Anesthesia Plan Comments:        Anesthesia Quick Evaluation

## 2018-03-27 NOTE — Anesthesia Procedure Notes (Signed)
Anesthesia Regional Block: Bier block (IV Regional)   Pre-Anesthetic Checklist: ,, timeout performed, Correct Patient, Correct Site, Correct Laterality, Correct Procedure,, site marked, surgical consent,, at surgeon's request  Laterality: Left     Needles:  Injection technique: Single-shot  Needle Type: Other      Needle Gauge: 22     Additional Needles:   Procedures:,,,,, intact distal pulses, Esmarch exsanguination, single tourniquet utilized,  Narrative:   Performed by: Personally       

## 2018-03-27 NOTE — Anesthesia Postprocedure Evaluation (Signed)
Anesthesia Post Note  Patient: Holly Carroll  Procedure(s) Performed: RELEASE TRIGGER LEFT LONG FINGER (Left Hand)     Patient location during evaluation: PACU Anesthesia Type: Bier Block Level of consciousness: awake and alert Pain management: pain level controlled Vital Signs Assessment: post-procedure vital signs reviewed and stable Respiratory status: spontaneous breathing, nonlabored ventilation, respiratory function stable and patient connected to nasal cannula oxygen Cardiovascular status: stable and blood pressure returned to baseline Postop Assessment: no apparent nausea or vomiting Anesthetic complications: no    Last Vitals:  Vitals:   03/27/18 1415 03/27/18 1426  BP: 139/74 126/84  Pulse: (!) 58 67  Resp: 16   Temp:  36.6 C  SpO2: 98% 99%    Last Pain:  Vitals:   03/27/18 1426  TempSrc:   PainSc: 0-No pain                 Effie Berkshire

## 2018-03-27 NOTE — Anesthesia Procedure Notes (Signed)
Procedure Name: MAC Date/Time: 03/27/2018 1:35 PM Performed by: Signe Colt, CRNA Pre-anesthesia Checklist: Patient identified, Emergency Drugs available, Suction available, Patient being monitored and Timeout performed Patient Re-evaluated:Patient Re-evaluated prior to induction Oxygen Delivery Method: Simple face mask

## 2018-03-27 NOTE — Discharge Instructions (Addendum)

## 2018-03-27 NOTE — Op Note (Signed)
03/27/2018 [facility]  Operative Note  PREOPERATIVE DIAGNOSIS: LEFT LONG FINGER TRIGGER DIGIT  POSTOPERATIVE DIAGNOSIS:  LEFT LONG FINGER TRIGGER DIGIT  PROCEDURE: Procedure(s): RELEASE TRIGGER LEFT LONG FINGER  SURGEON:  [name], MD  ASSISTANT:  none.  ANESTHESIA:  Bier block with sedation.  IV FLUIDS:  Per anesthesia flow sheet.  ESTIMATED BLOOD LOSS:  Minimal.  COMPLICATIONS:  None.  SPECIMENS:  None.  TOURNIQUET TIME:  Total Tourniquet Time Documented: Forearm (Left) - 22 minutes Total: Forearm (Left) - 22 minutes   DISPOSITION:  Stable to PACU.  LOCATION: [facility]  INDICATIONS: Holly Carroll is a 67 y.o. female with triggering left long finger.  This has recurred after injections.  She wishes to have a trigger release.  Risks, benefits and alternatives of surgery were discussed including the risk of blood loss, infection, damage to nerves, vessels, tendons, ligaments, bone, failure of surgery, need for additional surgery, complications with wound healing, continued pain, continued triggering and need for repeat surgery.  She voiced understanding of these risks and elected to proceed.  OPERATIVE COURSE:  After being identified preoperatively by myself, the patient and I agreed upon the procedure and site of procedure.  The surgical site was marked. The risks, benefits, and alternatives of surgery were reviewed and she wished to proceed.  Surgical consent had been signed. She was given IV Ancef as preoperative antibiotic prophylaxis. She was transported to the operating room and placed on the operating room table in supine position with the Left upper extremity on an arm board. Bier block anesthesia was induced by the anesthesiologist.  The Left upper extremity was prepped and draped in normal sterile orthopedic fashion. A surgical pause was performed between surgeons, anesthesia, and operating room staff, and all were in agreement as to  the patient, procedure, and site of procedure.  Tourniquet at the proximal aspect of the forearm had been inflated for the Bier block.  An incision was made at the volar aspect of the MP joint of the long finger.  This was carried into the subcutaneous tissues by preading technique.  Bipolar electrocautery was used to obtain hemostasis.  The radial and ulnar digital nerves were protected throughout the case. The flexor sheath was identified.  The A1 pulley was identified and sharply incised.  It was released in its entirety.  The proximal 1-2 mm of the A2 pulley was vented to allow better excursion of the tendons.  The finger was placed through a range of motion and there was noted to be no catching.  The tendons were brought through the wound and any adherences released.  The wound was then copiously irrigated with sterile saline. It was closed with 4-0 nylon in a horizontal mattress fashion.  It was injected with 0.25% plain Marcaine to aid in postoperative analgesia.  It was dressed with sterile Xeroform, 4x4s, and wrapped lightly with a Coban dressing.  Tourniquet was deflated at 22 minutes.  The fingertips were pink with brisk capillary refill after deflation of the tourniquet.  The operative drapes were broken down and the patient was awoken from anesthesia safely.  She was transferred back to the stretcher and taken to the PACU in stable condition.   I will see her back in the office in 1 week for postoperative followup.  I will give her a prescription for Norco 5/325 1-2 tabs PO q6 hours prn pain, dispense # 10.    [name], MD Electronically signed, 03/27/18

## 2018-03-28 ENCOUNTER — Encounter (HOSPITAL_BASED_OUTPATIENT_CLINIC_OR_DEPARTMENT_OTHER): Payer: Self-pay | Admitting: Orthopedic Surgery

## 2018-05-24 DIAGNOSIS — S83249A Other tear of medial meniscus, current injury, unspecified knee, initial encounter: Secondary | ICD-10-CM | POA: Insufficient documentation

## 2018-09-12 ENCOUNTER — Other Ambulatory Visit: Payer: Self-pay | Admitting: Orthopedic Surgery

## 2018-09-12 DIAGNOSIS — M25561 Pain in right knee: Secondary | ICD-10-CM

## 2018-09-12 DIAGNOSIS — G8929 Other chronic pain: Secondary | ICD-10-CM

## 2018-09-12 DIAGNOSIS — M25361 Other instability, right knee: Secondary | ICD-10-CM

## 2018-09-12 DIAGNOSIS — M2391 Unspecified internal derangement of right knee: Secondary | ICD-10-CM

## 2018-10-10 ENCOUNTER — Other Ambulatory Visit: Payer: Self-pay

## 2018-10-10 ENCOUNTER — Ambulatory Visit
Admission: RE | Admit: 2018-10-10 | Discharge: 2018-10-10 | Disposition: A | Discharge status: Home / Self Care | Payer: BLUE CROSS/BLUE SHIELD | Source: Ambulatory Visit | Attending: Orthopedic Surgery | Admitting: Orthopedic Surgery

## 2018-10-10 DIAGNOSIS — M25361 Other instability, right knee: Secondary | ICD-10-CM | POA: Diagnosis not present

## 2018-10-10 DIAGNOSIS — M2391 Unspecified internal derangement of right knee: Secondary | ICD-10-CM | POA: Diagnosis present

## 2018-10-10 DIAGNOSIS — M25561 Pain in right knee: Secondary | ICD-10-CM | POA: Diagnosis present

## 2018-10-10 DIAGNOSIS — G8929 Other chronic pain: Secondary | ICD-10-CM

## 2018-10-24 ENCOUNTER — Ambulatory Visit: Payer: BLUE CROSS/BLUE SHIELD

## 2018-12-26 ENCOUNTER — Encounter: Payer: Self-pay | Admitting: General Surgery

## 2019-05-09 ENCOUNTER — Other Ambulatory Visit: Payer: Self-pay

## 2019-05-09 ENCOUNTER — Ambulatory Visit (INDEPENDENT_AMBULATORY_CARE_PROVIDER_SITE_OTHER): Payer: BC Managed Care – PPO | Admitting: Podiatry

## 2019-05-09 ENCOUNTER — Encounter: Payer: Self-pay | Admitting: Podiatry

## 2019-05-09 DIAGNOSIS — M7752 Other enthesopathy of left foot: Secondary | ICD-10-CM

## 2019-05-09 DIAGNOSIS — Q828 Other specified congenital malformations of skin: Secondary | ICD-10-CM | POA: Diagnosis not present

## 2019-05-09 NOTE — Progress Notes (Signed)
She presents today chief complaint of pain to the subfifth metatarsophalangeal joint area of the left foot.  He states that I have a painful callus that is present.  It really hurts with my still toes.  Objective: Vital signs are stable she is alert oriented x3 pulses are strong and palpable.  She has fluctuance with what appears to be bursitis beneath the fifth metatarsal of the left foot and reactive porokeratotic lesion just beneath that.  This exquisitely tender on palpation.  She has tenderness on range of motion of the fifth toe as well.  Assessment: Capsulitis bursitis fifth metatarsophalangeal joint with underlying porokeratosis.  Plan: To the joint I injected 2 mg of dexamethasone and local anesthetic.  And enucleated the lesion subfifth left.  She tolerated procedure well without complications follow-up with her as needed.

## 2020-01-31 ENCOUNTER — Ambulatory Visit (INDEPENDENT_AMBULATORY_CARE_PROVIDER_SITE_OTHER): Payer: BC Managed Care – PPO

## 2020-01-31 ENCOUNTER — Ambulatory Visit (INDEPENDENT_AMBULATORY_CARE_PROVIDER_SITE_OTHER): Payer: BC Managed Care – PPO | Admitting: Podiatry

## 2020-01-31 ENCOUNTER — Other Ambulatory Visit: Payer: Self-pay

## 2020-01-31 ENCOUNTER — Encounter: Payer: Self-pay | Admitting: Podiatry

## 2020-01-31 DIAGNOSIS — E041 Nontoxic single thyroid nodule: Secondary | ICD-10-CM | POA: Insufficient documentation

## 2020-01-31 DIAGNOSIS — G4730 Sleep apnea, unspecified: Secondary | ICD-10-CM | POA: Insufficient documentation

## 2020-01-31 DIAGNOSIS — S9001XA Contusion of right ankle, initial encounter: Secondary | ICD-10-CM

## 2020-01-31 DIAGNOSIS — E781 Pure hyperglyceridemia: Secondary | ICD-10-CM | POA: Insufficient documentation

## 2020-01-31 DIAGNOSIS — M779 Enthesopathy, unspecified: Secondary | ICD-10-CM | POA: Diagnosis not present

## 2020-01-31 DIAGNOSIS — E669 Obesity, unspecified: Secondary | ICD-10-CM | POA: Insufficient documentation

## 2020-01-31 NOTE — Progress Notes (Signed)
Subjective:   Patient ID: Holly Carroll, female   DOB: 69 y.o.   MRN: [id number]   HPI Patient presents today with significant injury to her right ankle and states that she is developed swelling and pain deep within the joint.  She states she thinks she turns it she is not sure she is worried about the entire side of the ankle and then the deep joint has been sore   ROS      Objective:  Physical Exam  Neurovascular status intact negative Bevelyn Buckles' sign was noted with patient found to have quite a bit of edema in the lateral ankle around the anterior talofibular calcaneofibular ligament with inflammation of the fibula and then I also noted secondarily inflammation pain of the sinus tarsi right with fluid buildup     Assessment:  Trauma to the right ankle with possibility for fracture or other soft tissue bone injury with what appears to be inflammatory sinus tarsitis right with patient who still works     Plan:  H&P reviewed conditions.  Discussed x-rays with her today did sterile prep and injected the sinus tarsi right 3 mg Kenalog 5 mg Xylocaine dispensed ankle compression stocking and reappoint to recheck as needed and may require other treatments or immobilization depending on response  X-rays were negative for signs of fracture or diastases injury with moderate flattening of the arch noted

## 2020-04-22 ENCOUNTER — Other Ambulatory Visit: Payer: Self-pay

## 2020-04-22 ENCOUNTER — Ambulatory Visit
Admission: EM | Admit: 2020-04-22 | Discharge: 2020-04-22 | Disposition: A | Discharge status: Home / Self Care | Payer: BLUE CROSS/BLUE SHIELD | Attending: Family Medicine | Admitting: Family Medicine

## 2020-04-22 ENCOUNTER — Encounter: Payer: Self-pay | Admitting: Emergency Medicine

## 2020-04-22 DIAGNOSIS — U071 COVID-19: Secondary | ICD-10-CM | POA: Insufficient documentation

## 2020-04-22 LAB — RESP PANEL BY RT-PCR (FLU A&B, COVID) ARPGX2
Influenza A by PCR: NEGATIVE
Influenza B by PCR: NEGATIVE
SARS Coronavirus 2 by RT PCR: POSITIVE — AB

## 2020-04-22 LAB — URINALYSIS, COMPLETE (UACMP) WITH MICROSCOPIC
Bilirubin Urine: NEGATIVE
Glucose, UA: NEGATIVE mg/dL
Hgb urine dipstick: NEGATIVE
Ketones, ur: NEGATIVE mg/dL
Leukocytes,Ua: NEGATIVE
Nitrite: NEGATIVE
Protein, ur: 30 mg/dL — AB
Specific Gravity, Urine: 1.025 (ref 1.005–1.030)
pH: 5.5 (ref 5.0–8.0)

## 2020-04-22 MED ORDER — TRAMADOL HCL 50 MG PO TABS: 50.0000 mg | ORAL_TABLET | Freq: Three times a day (TID) | ORAL | 0 refills | Status: DC | PRN

## 2020-04-22 NOTE — ED Provider Notes (Signed)
[facility]    CSN: [id number] Arrival date & time: 04/22/20  1008      History   Chief Complaint Chief Complaint  Patient presents with  . Back Pain  . Dysuria  . Urinary Frequency   HPI  69 year old female presents with the above complaints.  Patient reports that she has not been feeling well since last Thursday.  She reports back pain which she described as achy.  Moderate in severity.  She reports that she has had some urinary symptoms as well.  She reports dysuria and urinary frequency.  No documented fever at home although her temperature is elevated here at 100.2.  She reports change in appetite.  Fatigue.  When asked, she reports that she has had some cough and some "sinus drainage".  No reported sick contacts.  No other complaints.  Past Medical History:  Diagnosis Date  . Arthritis   . Colon polyps   . Complication of anesthesia    hard to wake up,memory loss  . Diffuse cystic mastopathy 2013  . Diverticulosis   . History of bronchitis   . History of IBS 2011  . HOH (hard of hearing)   . HOH (hard of hearing)    AIDS  . Hyperlipidemia 2012  . Hypothyroidism   . Mammographic microcalcification 2011  . Menopause 1996  . Obesity, unspecified   . Osteoporosis   . Skin abscess    LEFT CHEEK REMOVED BY DR Kathyrn Sheriff FOR STAPH 4 WEEKS AGO AND ANTIBIOTICS FINISHED  . Sleep apnea    uses a cpap-will bring dos-will use post op  . Special screening for malignant neoplasms, colon   . Thyroid disease 2011  . Wears glasses     Patient Active Problem List   Diagnosis Date Noted  . Hypertriglyceridemia 01/31/2020  . Obesity (BMI 30-39.9) 01/31/2020  . Sleep apnea 01/31/2020  . Thyroid nodule 01/31/2020  . Medial meniscus tear 05/24/2018  . Carpal tunnel syndrome of left wrist 02/08/2018  . Trigger middle finger of left hand 02/08/2018  . History of colonic polyps 03/25/2015  . IBS (irritable bowel syndrome) 09/12/2013  . Fibrocystic breast 03/15/2013    . IGT (impaired glucose tolerance) 03/24/2009    Past Surgical History:  Procedure Laterality Date  . BIOPSY THYROID  2009   nodule  . BREAST CYST ASPIRATION Right 2005  . BREAST SURGERY Right October 2011   stereo bx  . CARPAL TUNNEL RELEASE  03/09/2012   Procedure: CARPAL TUNNEL RELEASE;  Surgeon: Cammie Sickle., MD;  Location: [facility];  Service: Orthopedics;  Laterality: Right;  . CATARACT EXTRACTION W/PHACO Right 11/20/2015   Procedure: CATARACT EXTRACTION PHACO AND INTRAOCULAR LENS PLACEMENT (IOC);  Surgeon: Eulogio Bear, MD;  Location: [facility];  Service: Ophthalmology;  Laterality: Right;  [place] 1.08 AP% 13.9 CDE 9.50 Fluid pack lot # [id number]  . CATARACT EXTRACTION W/PHACO Left 02/19/2016   Procedure: CATARACT EXTRACTION PHACO AND INTRAOCULAR LENS PLACEMENT ([place]);  Surgeon: Eulogio Bear, MD;  Location: [facility];  Service: Ophthalmology;  Laterality: Left;  [place] 42.5 AP% 12.8 CDE 5.41 Fluid Pack Lot # [id number]  . COLONOSCOPY  2009   Dr. Vira Agar  . COLONOSCOPY WITH PROPOFOL N/A 05/13/2015   Procedure: COLONOSCOPY WITH PROPOFOL;  Surgeon: Christene Lye, MD;  Location: [facility];  Service: Endoscopy;  Laterality: N/A;  . CYSTOCELE REPAIR    . 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 EYE SURGERY    . FRACTURE SURGERY    . HEEL SPUR SURGERY     both feet  . NOSE SURGERY     growth on nose as child  . ORIF SHOULDER FRACTURE    . PLANTAR FASCIA SURGERY     right  . RECTOCELE REPAIR    . TRIGGER FINGER RELEASE  03/09/2012   Procedure: RELEASE TRIGGER FINGER/A-1 PULLEY;  Surgeon: Cammie Sickle., MD;  Location: [place];  Service: Orthopedics;  Laterality: Right;  a-1 pulley release right ring   . TRIGGER FINGER RELEASE Left 03/27/2018   Procedure: RELEASE  TRIGGER LEFT LONG FINGER;  Surgeon: [name], MD;  Location: [place];  Service: Orthopedics;  Laterality: Left;    OB History    Gravida  0   Para      Term      Preterm      AB      Living        SAB      TAB      Ectopic      Multiple      Live Births           Obstetric Comments  Age with first menstruation-12         Home Medications    Prior to Admission medications   Medication Sig Start Date End Date Taking? Authorizing Provider  B12-ACTIVE 1 MG CHEW Chew by mouth. 11/16/19  Yes [provider]  Cholecalciferol (VITAMIN D-1000 MAX ST) 1000 units tablet Take by mouth.   Yes [provider]  naproxen sodium (ANAPROX) 220 MG tablet Take 220 mg by mouth daily as needed (pain).   Yes [provider]  rosuvastatin (CRESTOR) 10 MG tablet  09/14/17  Yes [provider]  SYNTHROID 75 MCG tablet Take 75 mcg by mouth daily. 10/15/19  Yes [provider]  traMADol (ULTRAM) 50 MG tablet Take 1 tablet (50 mg total) by mouth every 8 (eight) hours as needed. 04/22/20   Coral Spikes, DO    Family History Family History  Problem Relation Age of Onset  . Breast cancer Maternal Grandmother   . Cervical cancer Mother     Social History Social History   Tobacco Use  . Smoking status: Never Smoker  . Smokeless tobacco: Never Used  Vaping Use  . Vaping Use: Never used  Substance Use Topics  . Alcohol use: No  . Drug use: No     Allergies   Doxycycline and Tape   Review of Systems Review of Systems Per HPI  Physical Exam Triage Vital Signs ED Triage Vitals  Enc Vitals Group     BP 04/22/20 1110 126/83     Pulse Rate 04/22/20 1110 75     Resp 04/22/20 1110 18     Temp 04/22/20 1110 100.2 F (37.9 C)     Temp Source 04/22/20 1110 Oral     SpO2 04/22/20 1110 98 %     Weight 04/22/20 1109 214 lb 11.7 oz (97.4 kg)     Height 04/22/20 1109 5\' 4"  (1.626 m)     Head Circumference --       Peak Flow --      Pain Score 04/22/20 1109 6     Pain Loc --      Pain Edu? --  Excl. in [place]? --    Updated Vital Signs BP 126/83 (BP Location: Right Arm)   Pulse 75   Temp 100.2 F (37.9 C) (Oral)   Resp 18   Ht 5\' 4"  (1.626 m)   Wt 97.4 kg   SpO2 98%   BMI 36.86 kg/m   Visual Acuity Right Eye Distance:   Left Eye Distance:   Bilateral Distance:    Right Eye Near:   Left Eye Near:    Bilateral Near:     Physical Exam Vitals and nursing note reviewed.  Constitutional:      General: She is not in acute distress.    Appearance: She is obese.  HENT:     Head: Normocephalic and atraumatic.  Eyes:     General:        Right eye: No discharge.        Left eye: No discharge.     Conjunctiva/sclera: Conjunctivae normal.  Cardiovascular:     Rate and Rhythm: Normal rate and regular rhythm.  Pulmonary:     Effort: Pulmonary effort is normal.     Breath sounds: Normal breath sounds. No wheezing, rhonchi or rales.  Neurological:     Mental Status: She is alert.  Psychiatric:        Mood and Affect: Mood normal.        Behavior: Behavior normal.    UC Treatments / Results  Labs (all labs ordered are listed, but only abnormal results are displayed) Labs Reviewed  RESP PANEL BY RT-PCR (FLU A&B, COVID) ARPGX2 - Abnormal; Notable for the following components:      Result Value   SARS Coronavirus 2 by RT PCR POSITIVE (*)    All other components within normal limits  URINALYSIS, COMPLETE (UACMP) WITH MICROSCOPIC - Abnormal; Notable for the following components:   Protein, ur 30 (*)    Bacteria, UA FEW (*)    All other components within normal limits    EKG   Radiology No results found.  Procedures Procedures (including critical care time)  Medications Ordered in UC Medications - No data to display  Initial Impression / Assessment and Plan / UC Course  I have reviewed the triage vital signs and the nursing notes.  Pertinent labs & imaging results that  were available during my care of the patient were reviewed by me and considered in my medical decision making (see chart for details).    69 year old female presents with COVID-19.  Information sent to infusion clinic.  Tramadol for pain.  Tylenol for fever.  Supportive care.  Final Clinical Impressions(s) / UC Diagnoses   Final diagnoses:  [id number]     Discharge Instructions     Tylenol for fever.   Medication as directed for pain.  Infusion clinic will call.  Take care  Dr. Lacinda Axon    ED Prescriptions    Medication Sig Dispense Auth. Provider   traMADol (ULTRAM) 50 MG tablet Take 1 tablet (50 mg total) by mouth every 8 (eight) hours as needed. 15 tablet Thersa Salt G, DO     I have reviewed the PDMP during this encounter.   Coral Spikes, [place] 04/22/20 1333

## 2020-04-22 NOTE — Discharge Instructions (Addendum)
Tylenol for fever.   Medication as directed for pain.  Infusion clinic will call.  Take care  Dr. Lacinda Axon

## 2020-04-22 NOTE — ED Triage Notes (Signed)
Patient c/o back pain, dysuria and urinary frequency that started Thursday.

## 2020-04-23 ENCOUNTER — Other Ambulatory Visit: Payer: Self-pay | Admitting: Nurse Practitioner

## 2020-04-23 ENCOUNTER — Telehealth (HOSPITAL_COMMUNITY): Payer: Self-pay

## 2020-04-23 DIAGNOSIS — U071 COVID-19: Secondary | ICD-10-CM

## 2020-04-23 DIAGNOSIS — E669 Obesity, unspecified: Secondary | ICD-10-CM

## 2020-04-23 NOTE — Telephone Encounter (Signed)
Called patient to pre-screen for monoclonal antibody infusion after receiving recent positive test. Patient qualifies based off off co-morbid condition and/or member of an at risk group. Onset of symptoms 11/26, fever, chills, congestion body aches.  Patient Active Problem List   Diagnosis Date Noted  . Hypertriglyceridemia 01/31/2020  . Obesity (BMI 30-39.9) 01/31/2020  . Sleep apnea 01/31/2020  . Thyroid nodule 01/31/2020  . Medial meniscus tear 05/24/2018  . Carpal tunnel syndrome of left wrist 02/08/2018  . Trigger middle finger of left hand 02/08/2018  . History of colonic polyps 03/25/2015  . IBS (irritable bowel syndrome) 09/12/2013  . Fibrocystic breast 03/15/2013  . IGT (impaired glucose tolerance) 03/24/2009    Patient is interested in learning more about the infusion. RN forwarded information to APP's for additional screening/scheduling.   Carlean Purl, RN'

## 2020-04-23 NOTE — Progress Notes (Signed)
I connected by phone with Holly Carroll on 04/23/2020 at 11:58 AM to discuss the potential use of a new treatment for mild to moderate COVID-19 viral infection in non-hospitalized patients.  This patient is a 69 y.o. female that meets the FDA criteria for Emergency Use Authorization of COVID monoclonal antibody casirivimab/imdevimab, bamlanivimab/eteseviamb, or sotrovimab.  Has a (+) direct SARS-CoV-2 viral test result  Has mild or moderate COVID-19   Is NOT hospitalized due to COVID-19  Is within 10 days of symptom onset  Has at least one of the high risk factor(s) for progression to severe COVID-19 and/or hospitalization as defined in EUA.  Specific high risk criteria : Older age (>/= 69 yo) and BMI > 25   I have spoken and communicated the following to the patient or parent/caregiver regarding COVID monoclonal antibody treatment:  1. FDA has authorized the emergency use for the treatment of mild to moderate COVID-19 in adults and pediatric patients with positive results of direct SARS-CoV-2 viral testing who are 57 years of age and older weighing at least 40 kg, and who are at high risk for progressing to severe COVID-19 and/or hospitalization.  2. The significant known and potential risks and benefits of COVID monoclonal antibody, and the extent to which such potential risks and benefits are unknown.  3. Information on available alternative treatments and the risks and benefits of those alternatives, including clinical trials.  4. Patients treated with COVID monoclonal antibody should continue to self-isolate and use infection control measures (e.g., wear mask, isolate, social distance, avoid sharing personal items, clean and disinfect "high touch" surfaces, and frequent handwashing) according to CDC guidelines.   5. The patient or parent/caregiver has the option to accept or refuse COVID monoclonal antibody treatment.  After reviewing this information with the patient, the patient  has agreed to receive one of the available covid 19 monoclonal antibodies and will be provided an appropriate fact sheet prior to infusion. Jobe Gibbon, NP 04/23/2020 11:58 AM

## 2020-04-24 ENCOUNTER — Ambulatory Visit (HOSPITAL_COMMUNITY)
Admission: RE | Admit: 2020-04-24 | Discharge: 2020-04-24 | Disposition: A | Discharge status: Home / Self Care | Payer: BC Managed Care – PPO | Source: Ambulatory Visit | Attending: Pulmonary Disease | Admitting: Pulmonary Disease

## 2020-04-24 DIAGNOSIS — E669 Obesity, unspecified: Secondary | ICD-10-CM | POA: Insufficient documentation

## 2020-04-24 DIAGNOSIS — R54 Age-related physical debility: Secondary | ICD-10-CM | POA: Insufficient documentation

## 2020-04-24 DIAGNOSIS — U071 COVID-19: Secondary | ICD-10-CM | POA: Insufficient documentation

## 2020-04-24 DIAGNOSIS — Z6830 Body mass index (BMI) 30.0-30.9, adult: Secondary | ICD-10-CM | POA: Insufficient documentation

## 2020-04-24 MED ORDER — EPINEPHRINE 0.3 MG/0.3ML IJ SOAJ: 0.3000 mg | Freq: Once | INTRAMUSCULAR | Status: DC | PRN

## 2020-04-24 MED ORDER — ALBUTEROL SULFATE HFA 108 (90 BASE) MCG/ACT IN AERS: 2.0000 | INHALATION_SPRAY | Freq: Once | RESPIRATORY_TRACT | Status: DC | PRN

## 2020-04-24 MED ORDER — SODIUM CHLORIDE 0.9 % IV SOLN: INTRAVENOUS | Status: DC | PRN

## 2020-04-24 MED ORDER — DIPHENHYDRAMINE HCL 50 MG/ML IJ SOLN: 50.0000 mg | Freq: Once | INTRAMUSCULAR | Status: DC | PRN

## 2020-04-24 MED ORDER — METHYLPREDNISOLONE SODIUM SUCC 125 MG IJ SOLR: 125.0000 mg | Freq: Once | INTRAMUSCULAR | Status: DC | PRN

## 2020-04-24 MED ORDER — FAMOTIDINE IN NACL 20-0.9 MG/50ML-% IV SOLN: 20.0000 mg | Freq: Once | INTRAVENOUS | Status: DC | PRN

## 2020-04-24 MED ORDER — SOTROVIMAB 500 MG/8ML IV SOLN
500.0000 mg | Freq: Once | INTRAVENOUS | Status: AC
  Administered 2020-04-24: 500 mg via INTRAVENOUS

## 2020-04-24 NOTE — Progress Notes (Signed)
Patient reviewed Fact Sheet for Patients, Parents, and Caregivers for Emergency Use Authorization (EUA) of Sotrovimab for the Treatment of Coronavirus. Patient also reviewed and is agreeable to the estimated cost of treatment. Patient is agreeable to proceed.   

## 2020-04-24 NOTE — Discharge Instructions (Signed)

## 2020-04-24 NOTE — Progress Notes (Signed)
Diagnosis: COVID-19  Physician: Dr. Patrick Wright  Procedure: Covid Infusion Clinic Med: Sotrovimab infusion - Provided patient with sotrovimab fact sheet for patients, parents, and caregivers prior to infusion.   Complications: No immediate complications noted  Discharge: Discharged home    

## 2020-05-02 ENCOUNTER — Other Ambulatory Visit: Admission: RE | Admit: 2020-05-02 | Payer: BLUE CROSS/BLUE SHIELD | Source: Ambulatory Visit

## 2020-06-06 ENCOUNTER — Other Ambulatory Visit: Payer: BLUE CROSS/BLUE SHIELD

## 2020-09-19 ENCOUNTER — Encounter: Payer: Self-pay | Admitting: *Deleted

## 2020-09-22 ENCOUNTER — Encounter
Admission: RE | Disposition: A | Discharge status: Home / Self Care | Payer: Self-pay | Source: Home / Self Care | Attending: Gastroenterology

## 2020-09-22 ENCOUNTER — Other Ambulatory Visit: Payer: Self-pay

## 2020-09-22 ENCOUNTER — Ambulatory Visit: Payer: BC Managed Care – PPO | Admitting: Anesthesiology

## 2020-09-22 ENCOUNTER — Encounter: Payer: Self-pay | Admitting: *Deleted

## 2020-09-22 ENCOUNTER — Ambulatory Visit
Admission: RE | Admit: 2020-09-22 | Discharge: 2020-09-22 | Disposition: A | Discharge status: Home / Self Care | Payer: BC Managed Care – PPO | Attending: Gastroenterology | Admitting: Gastroenterology

## 2020-09-22 DIAGNOSIS — K644 Residual hemorrhoidal skin tags: Secondary | ICD-10-CM | POA: Insufficient documentation

## 2020-09-22 DIAGNOSIS — E039 Hypothyroidism, unspecified: Secondary | ICD-10-CM | POA: Diagnosis not present

## 2020-09-22 DIAGNOSIS — Z888 Allergy status to other drugs, medicaments and biological substances status: Secondary | ICD-10-CM | POA: Insufficient documentation

## 2020-09-22 DIAGNOSIS — Z79899 Other long term (current) drug therapy: Secondary | ICD-10-CM | POA: Insufficient documentation

## 2020-09-22 DIAGNOSIS — Z881 Allergy status to other antibiotic agents status: Secondary | ICD-10-CM | POA: Diagnosis not present

## 2020-09-22 DIAGNOSIS — K5730 Diverticulosis of large intestine without perforation or abscess without bleeding: Secondary | ICD-10-CM | POA: Insufficient documentation

## 2020-09-22 DIAGNOSIS — Z1211 Encounter for screening for malignant neoplasm of colon: Secondary | ICD-10-CM | POA: Diagnosis not present

## 2020-09-22 DIAGNOSIS — K640 First degree hemorrhoids: Secondary | ICD-10-CM | POA: Insufficient documentation

## 2020-09-22 DIAGNOSIS — K589 Irritable bowel syndrome without diarrhea: Secondary | ICD-10-CM | POA: Insufficient documentation

## 2020-09-22 DIAGNOSIS — Z79890 Hormone replacement therapy (postmenopausal): Secondary | ICD-10-CM | POA: Diagnosis not present

## 2020-09-22 HISTORY — PX: COLONOSCOPY WITH PROPOFOL: SHX5780

## 2020-09-22 SURGERY — COLONOSCOPY WITH PROPOFOL
Anesthesia: General

## 2020-09-22 MED ORDER — PROPOFOL 500 MG/50ML IV EMUL
INTRAVENOUS | Status: DC | PRN
  Administered 2020-09-22: 50 ug/kg/min via INTRAVENOUS

## 2020-09-22 MED ORDER — PROPOFOL 10 MG/ML IV BOLUS
INTRAVENOUS | Status: DC | PRN
  Administered 2020-09-22: 30 mg via INTRAVENOUS

## 2020-09-22 MED ORDER — LIDOCAINE HCL (CARDIAC) PF 100 MG/5ML IV SOSY
PREFILLED_SYRINGE | INTRAVENOUS | Status: DC | PRN
  Administered 2020-09-22: 60 mg via INTRAVENOUS

## 2020-09-22 MED ORDER — SODIUM CHLORIDE 0.9 % IV SOLN
INTRAVENOUS | Status: DC
  Administered 2020-09-22: 20 mL/h via INTRAVENOUS

## 2020-09-22 MED ORDER — FENTANYL CITRATE (PF) 100 MCG/2ML IJ SOLN
INTRAMUSCULAR | Status: AC
  Filled 2020-09-22: qty 2

## 2020-09-22 MED ORDER — LIDOCAINE HCL (PF) 2 % IJ SOLN
INTRAMUSCULAR | Status: AC
  Filled 2020-09-22: qty 5

## 2020-09-22 MED ORDER — PHENYLEPHRINE HCL (PRESSORS) 10 MG/ML IV SOLN
INTRAVENOUS | Status: AC
  Filled 2020-09-22: qty 2

## 2020-09-22 MED ORDER — MIDAZOLAM HCL 2 MG/2ML IJ SOLN
INTRAMUSCULAR | Status: AC
  Filled 2020-09-22: qty 2

## 2020-09-22 MED ORDER — MIDAZOLAM HCL 2 MG/2ML IJ SOLN
INTRAMUSCULAR | Status: DC | PRN
  Administered 2020-09-22: 2 mg via INTRAVENOUS

## 2020-09-22 MED ORDER — FENTANYL CITRATE (PF) 100 MCG/2ML IJ SOLN
INTRAMUSCULAR | Status: DC | PRN
  Administered 2020-09-22: 50 ug via INTRAVENOUS
  Administered 2020-09-22 (×2): 25 ug via INTRAVENOUS

## 2020-09-22 NOTE — H&P (Signed)
Outpatient short stay form Pre-procedure 09/22/2020 7:38 AM Holly Miyamoto MD, MPH  Primary Physician: NP Gauger  Reason for visit:  Surveillance colonoscopy  History of present illness:   70 y/o lady with history of IBS and hypothyroidism here for surveillance colonoscopy. No significant abdominal surgeries. No blood thinners. No family history of GI malignancies.    Current Facility-Administered Medications:  .  0.9 %  sodium chloride infusion, , Intravenous, Continuous, Joia Doyle, Hilton Cork, MD, Last Rate: 20 mL/hr at 09/22/20 0655, 20 mL/hr at 09/22/20 0655  Medications Prior to Admission  Medication Sig Dispense Refill Last Dose  . B12-ACTIVE 1 MG CHEW Chew by mouth.   Past Week at Unknown time  . Cholecalciferol 25 MCG (1000 UT) tablet Take by mouth.   Past Week at Unknown time  . naproxen sodium (ANAPROX) 220 MG tablet Take 220 mg by mouth daily as needed (pain).   Past Week at Unknown time  . rosuvastatin (CRESTOR) 10 MG tablet    Past Week at Unknown time  . SYNTHROID 75 MCG tablet Take 75 mcg by mouth daily.   Past Week at Unknown time  . traMADol (ULTRAM) 50 MG tablet Take 1 tablet (50 mg total) by mouth every 8 (eight) hours as needed. 15 tablet 0 Past Week at Unknown time     Allergies  Allergen Reactions  . Doxycycline Other (See Comments)    Losses balance  . Tape Other (See Comments)    Adhesive/white leaves burn marks and blister     Past Medical History:  Diagnosis Date  . Arthritis   . Colon polyps   . Complication of anesthesia    hard to wake up,memory loss  . Diffuse cystic mastopathy 2013  . Diverticulosis   . History of bronchitis   . History of IBS 2011  . HOH (hard of hearing)   . HOH (hard of hearing)    AIDS  . Hyperlipidemia 2012  . Hypothyroidism   . Mammographic microcalcification 2011  . Menopause 1996  . Obesity, unspecified   . Osteoporosis   . Skin abscess    LEFT CHEEK REMOVED BY DR Kathyrn Sheriff FOR STAPH 4 WEEKS AGO AND ANTIBIOTICS  FINISHED  . Sleep apnea    uses a cpap-will bring dos-will use post op  . Special screening for malignant neoplasms, colon   . Thyroid disease 2011  . Wears glasses     Review of systems:  Otherwise negative.    Physical Exam  Gen: Alert, oriented. Appears stated age.  HEENT: PERRLA. Lungs: No respiratory distress CV: RRR Abd: soft, benign, no masses Ext: No edema    Planned procedures: Proceed with colonoscopy. The patient understands the nature of the planned procedure, indications, risks, alternatives and potential complications including but not limited to bleeding, infection, perforation, damage to internal organs and possible oversedation/side effects from anesthesia. The patient agrees and gives consent to proceed.  Please refer to procedure notes for findings, recommendations and patient disposition/instructions.     Holly Miyamoto MD, MPH Gastroenterology 09/22/2020  7:38 AM

## 2020-09-22 NOTE — Anesthesia Preprocedure Evaluation (Signed)
Anesthesia Evaluation  Patient identified by MRN, date of birth, ID band Patient awake    Reviewed: Allergy & Precautions, H&P , NPO status , Patient's Chart, lab work & pertinent test results  History of Anesthesia Complications (+) PROLONGED EMERGENCE and history of anesthetic complications  Airway Mallampati: III  TM Distance: <3 FB Neck ROM: full    Dental  (+) Chipped, Caps   Pulmonary neg shortness of breath, sleep apnea ,    Pulmonary exam normal        Cardiovascular Exercise Tolerance: Good (-) angina(-) Past MI and (-) DOE negative cardio ROS Normal cardiovascular exam     Neuro/Psych  Neuromuscular disease negative psych ROS   GI/Hepatic negative GI ROS, Neg liver ROS, neg GERD  ,  Endo/Other  Hypothyroidism   Renal/GU negative Renal ROS  negative genitourinary   Musculoskeletal  (+) Arthritis ,   Abdominal   Peds  Hematology negative hematology ROS (+)   Anesthesia Other Findings Past Medical History: No date: Arthritis No date: Colon polyps No date: Complication of anesthesia     Comment:  hard to wake up,memory loss 2013: Diffuse cystic mastopathy No date: Diverticulosis No date: History of bronchitis 2011: History of IBS No date: HOH (hard of hearing) No date: HOH (hard of hearing)     Comment:  AIDS 2012: Hyperlipidemia No date: Hypothyroidism 2011: Mammographic microcalcification 1996: Menopause No date: Obesity, unspecified No date: Osteoporosis No date: Skin abscess     Comment:  LEFT CHEEK REMOVED BY DR Kathyrn Sheriff FOR STAPH 4 WEEKS AGO               AND ANTIBIOTICS FINISHED No date: Sleep apnea     Comment:  uses a cpap-will bring dos-will use post op No date: Special screening for malignant neoplasms, colon 2011: Thyroid disease No date: Wears glasses  Past Surgical History: 2009: BIOPSY THYROID     Comment:  nodule 2005: BREAST CYST ASPIRATION; Right October 2011: BREAST  SURGERY; Right     Comment:  stereo bx 03/09/2012: CARPAL TUNNEL RELEASE     Comment:  Procedure: CARPAL TUNNEL RELEASE;  Surgeon: Cammie Sickle., MD;  Location: [place];                Service: Orthopedics;  Laterality: Right; 11/20/2015: CATARACT EXTRACTION W/PHACO; Right     Comment:  Procedure: CATARACT EXTRACTION PHACO AND INTRAOCULAR               LENS PLACEMENT (IOC);  Surgeon: Eulogio Bear, MD;                Location: [facility];  Service: Ophthalmology;  Laterality:              Right;  [place] 1.08 AP% 13.9 CDE 9.50 Fluid pack lot #               [id number] 02/19/2016: CATARACT EXTRACTION W/PHACO; Left     Comment:  Procedure: CATARACT EXTRACTION PHACO AND INTRAOCULAR               LENS PLACEMENT (IOC);  Surgeon: Eulogio Bear, MD;                Location: [facility];  Service: Ophthalmology;  Laterality:              Left;  [place] 42.5 AP% 12.8 CDE 5.41 Fluid Pack  Lot #               [id number] 2009: COLONOSCOPY     Comment:  Dr. Vira Agar 05/13/2015: COLONOSCOPY WITH PROPOFOL; N/A     Comment:  Procedure: COLONOSCOPY WITH PROPOFOL;  Surgeon:               Christene Lye, MD;  Location: [facility];                Service: Endoscopy;  Laterality: N/A; No date: CYSTOCELE REPAIR 03/09/2012: DUPUYTREN CONTRACTURE RELEASE     Comment:  Procedure: DUPUYTREN CONTRACTURE RELEASE;  Surgeon:               Cammie Sickle., MD;  Location: [place];  Service: Orthopedics;  Laterality: Right; No date: EYE SURGERY No date: FRACTURE SURGERY No date: HEEL SPUR SURGERY     Comment:  both feet No date: NOSE SURGERY     Comment:  growth on nose as child No date: ORIF SHOULDER FRACTURE No date: PLANTAR FASCIA SURGERY     Comment:  right No date: RECTOCELE REPAIR 03/09/2012: TRIGGER FINGER RELEASE     Comment:  Procedure: RELEASE TRIGGER FINGER/A-1 PULLEY;  Surgeon:               Cammie Sickle., MD;   Location: [place];  Service: Orthopedics;  Laterality: Right;  a-1               pulley release right ring  03/27/2018: TRIGGER FINGER RELEASE; Left     Comment:  Procedure: RELEASE TRIGGER LEFT LONG FINGER;  Surgeon:               [name], MD;  Location: [place];                Service: Orthopedics;  Laterality: Left;  BMI    Body Mass Index: 36.73 kg/m      Reproductive/Obstetrics negative OB ROS                             Anesthesia Physical Anesthesia Plan  ASA: III  Anesthesia Plan: General   Post-op Pain Management:    Induction: Intravenous  PONV Risk Score and Plan: Propofol infusion and TIVA  Airway Management Planned: Natural Airway and Nasal Cannula  Additional Equipment:   Intra-op Plan:   Post-operative Plan:   Informed Consent: I have reviewed the patients History and Physical, chart, labs and discussed the procedure including the risks, benefits and alternatives for the proposed anesthesia with the patient or authorized representative who has indicated his/her understanding and acceptance.     Dental Advisory Given  Plan Discussed with: Anesthesiologist, CRNA and Surgeon  Anesthesia Plan Comments: (Patient consented for risks of anesthesia including but not limited to:  - adverse reactions to medications - risk of airway placement if required - damage to eyes, teeth, lips or other oral mucosa - nerve damage due to positioning  - sore throat or hoarseness - Damage to heart, brain, nerves, lungs, other parts of body or loss of life  Patient voiced understanding.)        Anesthesia Quick Evaluation

## 2020-09-22 NOTE — Op Note (Signed)
[facility]astroenterology Patient Name: Holly Carroll Procedure Date: 09/22/2020 7:15 AM MRN: [id number] Account #: [id number] Date of Birth: 01-16-1951 Admit Type: Outpatient Age: 70 Room: [facility] ENDO ROOM 3 Gender: Female Note Status: Finalized Procedure:             Colonoscopy Indications:           High risk colon cancer surveillance: Personal history                         of non-advanced adenoma Providers:             Andrey Farmer MD, MD Referring MD:          Juluis Rainier (Referring MD) Medicines:             Monitored Anesthesia Care Complications:         No immediate complications. Procedure:             Pre-Anesthesia Assessment:                        - Prior to the procedure, a History and Physical was                         performed, and patient medications and allergies were                         reviewed. The patient is competent. The risks and                         benefits of the procedure and the sedation options and                         risks were discussed with the patient. All questions                         were answered and informed consent was obtained.                         Patient identification and proposed procedure were                         verified by the physician, the nurse, the anesthetist                         and the technician in the endoscopy suite. Mental                         Status Examination: alert and oriented. Airway                         Examination: normal oropharyngeal airway and neck                         mobility. Respiratory Examination: clear to                         auscultation. CV Examination: normal. Prophylactic  Antibiotics: The patient does not require prophylactic                         antibiotics. Prior Anticoagulants: The patient has                         taken no previous anticoagulant or antiplatelet                         agents. ASA Grade  Assessment: II - A patient with mild                         systemic disease. After reviewing the risks and                         benefits, the patient was deemed in satisfactory                         condition to undergo the procedure. The anesthesia                         plan was to use monitored anesthesia care (MAC).                         Immediately prior to administration of medications,                         the patient was re-assessed for adequacy to receive                         sedatives. The heart rate, respiratory rate, oxygen                         saturations, blood pressure, adequacy of pulmonary                         ventilation, and response to care were monitored                         throughout the procedure. The physical status of the                         patient was re-assessed after the procedure.                        After obtaining informed consent, the colonoscope was                         passed under direct vision. Throughout the procedure,                         the patient's blood pressure, pulse, and oxygen                         saturations were monitored continuously. The                         Colonoscope was introduced through the anus and  advanced to the the cecum, identified by appendiceal                         orifice and ileocecal valve. The colonoscopy was                         performed without difficulty. The patient tolerated                         the procedure well. The quality of the bowel                         preparation was good. Findings:      The perianal and digital rectal examinations were normal.      A few small-mouthed diverticula were found in the transverse colon and       hepatic flexure.      Multiple small-mouthed diverticula were found in the sigmoid colon and       descending colon.      External and internal hemorrhoids were found during retroflexion. The        hemorrhoids were Grade I (internal hemorrhoids that do not prolapse).      The exam was otherwise without abnormality on direct and retroflexion       views. Impression:            - Diverticulosis in the transverse colon and at the                         hepatic flexure.                        - Diverticulosis in the sigmoid colon and in the                         descending colon.                        - External and internal hemorrhoids.                        - The examination was otherwise normal on direct and                         retroflexion views.                        - No specimens collected. Recommendation:        - Discharge patient to home.                        - Resume previous diet.                        - Continue present medications.                        - Repeat colonoscopy in 10 years for surveillance.                        - Return to referring physician as previously  scheduled. Procedure Code(s):     --- Professional ---                        Y2824, Colorectal cancer screening; colonoscopy on                         individual at high risk Diagnosis Code(s):     --- Professional ---                        Z86.010, Personal history of colonic polyps                        K64.0, First degree hemorrhoids                        K57.30, Diverticulosis of large intestine without                         perforation or abscess without bleeding CPT copyright 2019 American Medical Association. All rights reserved. The codes documented in this report are preliminary and upon coder review may  be revised to meet current compliance requirements. Andrey Farmer MD, MD 09/22/2020 8:02:02 AM Number of Addenda: 0 Note Initiated On: 09/22/2020 7:15 AM Scope Withdrawal Time: 0 hours 10 minutes 9 seconds  Total Procedure Duration: 0 hours 13 minutes 10 seconds  Estimated Blood Loss:  Estimated blood loss: none.      [facility]

## 2020-09-22 NOTE — Transfer of Care (Signed)
Immediate Anesthesia Transfer of Care Note  Patient: Holly Carroll  Procedure(s) Performed: COLONOSCOPY WITH PROPOFOL (N/A )  Patient Location: PACU  Anesthesia Type:General  Level of Consciousness: sedated  Airway & Oxygen Therapy: Patient Spontanous Breathing  Post-op Assessment: Report given to RN and Post -op Vital signs reviewed and stable  Post vital signs: Reviewed and stable  Last Vitals:  Vitals Value Taken Time  BP 87/49 09/22/20 0802  Temp    Pulse 63 09/22/20 0802  Resp 14 09/22/20 0802  SpO2 95 % 09/22/20 0802  Vitals shown include unvalidated device data.  Last Pain:  Vitals:   09/22/20 0644  TempSrc: Temporal  PainSc: 0-No pain         Complications: No complications documented.

## 2020-09-22 NOTE — Anesthesia Postprocedure Evaluation (Signed)
Anesthesia Post Note  Patient: Holly Carroll  Procedure(s) Performed: COLONOSCOPY WITH PROPOFOL (N/A )  Patient location during evaluation: Endoscopy Anesthesia Type: General Level of consciousness: awake and alert Pain management: pain level controlled Vital Signs Assessment: post-procedure vital signs reviewed and stable Respiratory status: spontaneous breathing, nonlabored ventilation, respiratory function stable and patient connected to nasal cannula oxygen Cardiovascular status: blood pressure returned to baseline and stable Postop Assessment: no apparent nausea or vomiting Anesthetic complications: no   No complications documented.   Last Vitals:  Vitals:   09/22/20 0822 09/22/20 0832  BP: 108/60 109/72  Pulse:    Resp:    Temp:    SpO2:      Last Pain:  Vitals:   09/22/20 0832  TempSrc:   PainSc: 0-No pain                 [name]

## 2020-09-22 NOTE — Interval H&P Note (Signed)
History and Physical Interval Note:  09/22/2020 7:40 AM  Holly Carroll  has presented today for surgery, with the diagnosis of hx adenomatous colonic polyp.  The various methods of treatment have been discussed with the patient and family. After consideration of risks, benefits and other options for treatment, the patient has consented to  Procedure(s): COLONOSCOPY WITH PROPOFOL (N/A) as a surgical intervention.  The patient's history has been reviewed, patient examined, no change in status, stable for surgery.  I have reviewed the patient's chart and labs.  Questions were answered to the patient's satisfaction.     Lesly Rubenstein  Ok to proceed with colonoscopy

## 2020-09-23 ENCOUNTER — Encounter: Payer: Self-pay | Admitting: Gastroenterology

## 2021-10-26 ENCOUNTER — Ambulatory Visit: Payer: BC Managed Care – PPO | Admitting: Podiatry

## 2021-11-05 ENCOUNTER — Ambulatory Visit (INDEPENDENT_AMBULATORY_CARE_PROVIDER_SITE_OTHER): Payer: BC Managed Care – PPO

## 2021-11-05 ENCOUNTER — Encounter: Payer: Self-pay | Admitting: Podiatry

## 2021-11-05 ENCOUNTER — Ambulatory Visit (INDEPENDENT_AMBULATORY_CARE_PROVIDER_SITE_OTHER): Payer: BC Managed Care – PPO | Admitting: Podiatry

## 2021-11-05 DIAGNOSIS — M779 Enthesopathy, unspecified: Secondary | ICD-10-CM

## 2021-11-05 DIAGNOSIS — S99921A Unspecified injury of right foot, initial encounter: Secondary | ICD-10-CM | POA: Diagnosis not present

## 2021-11-05 DIAGNOSIS — M722 Plantar fascial fibromatosis: Secondary | ICD-10-CM

## 2021-11-05 MED ORDER — TRIAMCINOLONE ACETONIDE 10 MG/ML IJ SUSP
10.0000 mg | Freq: Once | INTRAMUSCULAR | Status: AC
  Administered 2021-11-05: 10 mg

## 2021-11-05 NOTE — Progress Notes (Signed)
Subjective:   Patient ID: Holly Carroll, female   DOB: 71 y.o.   MRN: [id number]   HPI Patient states she severely traumatized her right fifth toe yesterday and is concerned about fracture and has inflammation pain of the plantar left heel tendon that is been sore making it hard to walk and has been present for several months   ROS      Objective:  Physical Exam  Neurovascular status intact with inflammation pain of the plantar fascia left with fluid buildup and redness swelling and discoloration of the right fifth digit     Assessment:  Acute Planter fasciitis left with inflammation noted along with traumatized right fifth digit with possibility for fracture     Plan:  H&P reviewed both conditions.  For the right I did discuss results of x-rays recommended wide shoe gear soaks and compression as needed.  For the left I did sterile prep injected the plantar fascia 3 mg Kenalog 5 mg Xylocaine and advised on good support shoes  X-rays do indicate on the right that there may be a small fracture right fifth digit or may be just a contusion or other pathology no joint involvement left shows moderate flatfoot deformity spur formation sign visit

## 2021-12-30 ENCOUNTER — Ambulatory Visit (INDEPENDENT_AMBULATORY_CARE_PROVIDER_SITE_OTHER): Payer: BC Managed Care – PPO | Admitting: Podiatry

## 2021-12-30 ENCOUNTER — Encounter: Payer: Self-pay | Admitting: Podiatry

## 2021-12-30 DIAGNOSIS — M7752 Other enthesopathy of left foot: Secondary | ICD-10-CM | POA: Diagnosis not present

## 2021-12-30 DIAGNOSIS — D2372 Other benign neoplasm of skin of left lower limb, including hip: Secondary | ICD-10-CM

## 2021-12-30 MED ORDER — DEXAMETHASONE SODIUM PHOSPHATE 120 MG/30ML IJ SOLN
2.0000 mg | Freq: Once | INTRAMUSCULAR | Status: AC
  Administered 2021-12-30: 2 mg via INTRA_ARTICULAR

## 2021-12-30 NOTE — Progress Notes (Signed)
Holly Carroll presents today chief complaint of a painful lesion to the plantar aspect of her forefoot beneath the fifth metatarsal head as she points to that area.  She also has a painful callus just at the level of the fifth metatarsal base of the left foot as well.  She states that both of these cause me to walk abnormally and it hurts.  Objective: Vital signs are stable she is alert oriented x 3 pulses are palpable.  There is no erythema edema salines drainage noted though she does have a palpable mass beneath the fifth metatarsal head of the left foot which is fluctuant and moves on palpation.  She also has a benign skin lesion subfifth metatarsal base left foot.  No open lesions or wounds are noted.  Assessment: Benign skin lesion subfifth metatarsal base left foot bursitis subfifth metatarsal head left foot.  Plan: I injected dexamethasone local anesthetic beneath the fifth metatarsal head into the bursa today.  Providing her with almost immediate relief.  I then enucleated the lesion at the fifth met base placed salicylic acid under occlusion to be left on for 3 days then washed off thoroughly.  I will follow-up with her on an as-needed basis.

## 2022-04-12 ENCOUNTER — Emergency Department: Payer: No Typology Code available for payment source

## 2022-04-12 ENCOUNTER — Emergency Department
Admission: EM | Admit: 2022-04-12 | Discharge: 2022-04-12 | Disposition: A | Discharge status: Home / Self Care | Payer: No Typology Code available for payment source | Attending: Emergency Medicine | Admitting: Emergency Medicine

## 2022-04-12 ENCOUNTER — Other Ambulatory Visit: Payer: Self-pay

## 2022-04-12 DIAGNOSIS — W010XXA Fall on same level from slipping, tripping and stumbling without subsequent striking against object, initial encounter: Secondary | ICD-10-CM | POA: Insufficient documentation

## 2022-04-12 DIAGNOSIS — Y990 Civilian activity done for income or pay: Secondary | ICD-10-CM | POA: Insufficient documentation

## 2022-04-12 DIAGNOSIS — S0990XA Unspecified injury of head, initial encounter: Secondary | ICD-10-CM | POA: Diagnosis present

## 2022-04-12 DIAGNOSIS — S0003XA Contusion of scalp, initial encounter: Secondary | ICD-10-CM | POA: Insufficient documentation

## 2022-04-12 DIAGNOSIS — W19XXXA Unspecified fall, initial encounter: Secondary | ICD-10-CM

## 2022-04-12 NOTE — ED Triage Notes (Addendum)
Pt slipped on a wet floor at work tonight and hit head on the floor.   Hematoma to back of head.   No loc  no vomiting. No headache.  No neck or back pain.  Pt alert   speech clear.  States WC

## 2022-04-12 NOTE — ED Provider Notes (Signed)
   [facility] Provider Note    Event Date/Time   First MD Initiated Contact with Patient 04/12/22 2147     (approximate)   History   Fall and Head Injury   HPI  ALEXZA NORBECK is a 71 y.o. female who presents after a slip and fall while at work.  Patient reports she slipped on a wet area and her feet went out from underneath her and she fell flat on her back and hit her head.  She denies LOC.  No neurodeficits.     Physical Exam   Triage Vital Signs: ED Triage Vitals  Enc Vitals Group     BP 04/12/22 2136 (!) 150/89     Pulse Rate 04/12/22 2136 79     Resp 04/12/22 2136 18     Temp 04/12/22 2136 98.6 F (37 C)     Temp Source 04/12/22 2136 Oral     SpO2 04/12/22 2136 97 %     Weight 04/12/22 2133 97.5 kg (215 lb)     Height 04/12/22 2133 1.651 m ('5\' 5"'$ )     Head Circumference --      Peak Flow --      Pain Score 04/12/22 2132 3     Pain Loc --      Pain Edu? --      Excl. in [place]? --     Most recent vital signs: Vitals:   04/12/22 2136  BP: (!) 150/89  Pulse: 79  Resp: 18  Temp: 98.6 F (37 C)  SpO2: 97%     General: Awake, no distress.  CV:  Good peripheral perfusion.  Resp:  Normal effort.  Abd:  No distention.  Other:  Hematoma noted to the crown of the scalp   ED Results / Procedures / Treatments   Labs (all labs ordered are listed, but only abnormal results are displayed) Labs Reviewed - No data to display   EKG     RADIOLOGY CT head viewed interpreted by me, no ICH    PROCEDURES:  Critical Care performed:   Procedures   MEDICATIONS ORDERED IN ED: Medications - No data to display   IMPRESSION / MDM / [place] / ED COURSE  I reviewed the triage vital signs and the nursing notes. Patient's presentation is most consistent with acute complicated illness / injury requiring diagnostic workup.   Patient presents after mechanical fall as above.  CT head and cervical spine are reassuring.  She  is not on blood thinners.  No further work-up necessary at this time.       FINAL CLINICAL IMPRESSION(S) / ED DIAGNOSES   Final diagnoses:  Fall, initial encounter  Injury of head, initial encounter     Rx / DC Orders   ED Discharge Orders     None        Note:  This document was prepared using Dragon voice recognition software and may include unintentional dictation errors.   Lavonia Drafts, MD 04/12/22 2218

## 2022-04-12 NOTE — ED Notes (Signed)
Pt employed with [employer], here for post accident evaluation; accomp by supervisor with form that requests UDS needed; informed that no blood or urine drug screening performed now for workers comp, only oral swabbing;  Allean Found [phone]) safety manager requests oral swabbing

## 2022-12-16 ENCOUNTER — Other Ambulatory Visit: Payer: Self-pay | Admitting: Student

## 2022-12-16 DIAGNOSIS — R42 Dizziness and giddiness: Secondary | ICD-10-CM

## 2023-01-06 ENCOUNTER — Ambulatory Visit
Admission: RE | Admit: 2023-01-06 | Discharge: 2023-01-06 | Disposition: A | Discharge status: Home / Self Care | Payer: BC Managed Care – PPO | Source: Ambulatory Visit | Attending: Student | Admitting: Student

## 2023-01-06 DIAGNOSIS — R42 Dizziness and giddiness: Secondary | ICD-10-CM

## 2023-01-06 MED ORDER — GADOPICLENOL 0.5 MMOL/ML IV SOLN
10.0000 mL | Freq: Once | INTRAVENOUS | Status: AC | PRN
  Administered 2023-01-06: 10 mL via INTRAVENOUS

## 2023-09-02 NOTE — Progress Notes (Signed)
 Subjective:  Patient ID: Holly Carroll is a 73 y.o. female her for a B12 Injection visit.                 Flu Offering:        Objective:     Assessment/Plan:     After verifying current prescription B12 injection was administered per [facility] guidelines.

## 2024-01-02 ENCOUNTER — Other Ambulatory Visit: Payer: Self-pay

## 2024-01-02 ENCOUNTER — Emergency Department
Admission: EM | Admit: 2024-01-02 | Discharge: 2024-01-03 | Disposition: A | Discharge status: Home / Self Care | Attending: Emergency Medicine | Admitting: Emergency Medicine

## 2024-01-02 DIAGNOSIS — H5789 Other specified disorders of eye and adnexa: Secondary | ICD-10-CM

## 2024-01-02 NOTE — ED Triage Notes (Addendum)
 Pt reports earlier today she developed left eye pain, pt denies injury or trauma to eye. Pt states discomfort when she is closing her eye. Pt reports she had cataracts surgery and lens implant to bilateral eyes aprox 10 years ago

## 2024-01-03 DIAGNOSIS — H5789 Other specified disorders of eye and adnexa: Secondary | ICD-10-CM | POA: Diagnosis not present

## 2024-01-03 MED ORDER — FLUORESCEIN SODIUM 1 MG OP STRP
1.0000 | ORAL_STRIP | Freq: Once | OPHTHALMIC | Status: DC
  Filled 2024-01-03: qty 1

## 2024-01-03 MED ORDER — TETRACAINE HCL 0.5 % OP SOLN
1.0000 [drp] | Freq: Once | OPHTHALMIC | Status: DC
  Filled 2024-01-03: qty 4

## 2024-01-03 MED ORDER — POLYMYXIN B-TRIMETHOPRIM 10000-0.1 UNIT/ML-% OP SOLN: 1.0000 [drp] | Freq: Four times a day (QID) | OPHTHALMIC | 0 refills | Status: AC

## 2024-01-03 MED ORDER — POLYMYXIN B-TRIMETHOPRIM 10000-0.1 UNIT/ML-% OP SOLN
1.0000 [drp] | OPHTHALMIC | Status: DC
  Administered 2024-01-03 (×2): 1 [drp] via OPHTHALMIC
  Filled 2024-01-03: qty 10

## 2024-01-03 NOTE — ED Provider Notes (Signed)
 [facility] Provider Note    Event Date/Time   First MD Initiated Contact with Patient 01/03/24 [phone]     (approximate)   History   Eye Pain   HPI  Holly Carroll is a 73 y.o. female   Past medical history of none pertinent health history presents to the Emergency Department with irritation of her left eye.  Onset in the afternoon at work when the heating system came on.  Foreign body sensation grittiness to the medial part of the left eye.  Watery eyes.  No purulent discharge.  Vision intact.  No significant pain.  No other acute medical complaints.   External Medical Documents Reviewed: Outpatient notes.      Physical Exam   Triage Vital Signs: ED Triage Vitals  Encounter Vitals Group     BP 01/02/24 1919 (!) 149/96     Girls Systolic BP Percentile --      Girls Diastolic BP Percentile --      Boys Systolic BP Percentile --      Boys Diastolic BP Percentile --      Pulse Rate 01/02/24 1919 75     Resp 01/02/24 1919 18     Temp 01/02/24 1919 98.7 F (37.1 C)     Temp Source 01/03/24 0007 Oral     SpO2 01/02/24 1919 97 %     Weight 01/02/24 1918 220 lb (99.8 kg)     Height 01/02/24 1918 5' 4 (1.626 m)     Head Circumference --      Peak Flow --      Pain Score 01/02/24 1918 4     Pain Loc --      Pain Education --      Exclude from Growth Chart --     Most recent vital signs: Vitals:   01/02/24 1919 01/03/24 0007  BP: (!) 149/96 (!) 145/76  Pulse: 75 60  Resp: 18 20  Temp: 98.7 F (37.1 C) 99 F (37.2 C)  SpO2: 97% 100%    General: Awake, no distress.  CV:  Good peripheral perfusion.  Resp:  Normal effort.  Abd:  No distention.  Other:  Watery discharge in the left eye.  Extraocular movements full and intact.  Under fluorescein  exam, no corneal defects noted.  Intraocular pressure noted on the left side to be 18.  No redness or swelling on the skin adjacent to the eyes.  No foreign body noted on the sweep of the underside of  the eyelid with a moist cotton swab.   ED Results / Procedures / Treatments   Labs (all labs ordered are listed, but only abnormal results are displayed) Labs Reviewed - No data to display Procedures   MEDICATIONS ORDERED IN ED: Medications  tetracaine  (PONTOCAINE) 0.5 % ophthalmic solution 1 drop (has no administration in time range)  fluorescein  ophthalmic strip 1 strip (has no administration in time range)  trimethoprim -polymyxin b  (POLYTRIM ) ophthalmic solution 1 drop (has no administration in time range)     IMPRESSION / MDM / ASSESSMENT AND PLAN / ED COURSE  I reviewed the triage vital signs and the nursing notes.                                Patient's presentation is most consistent with acute presentation with potential threat to life or bodily function.  Differential diagnosis includes, but is not limited to, blepharitis, hordeolum,  dacryostenosis or dacryocystitis, conjunctivitis, foreign body, corneal abrasion or ulceration considered but less likely other eye emergencies   MDM:    Watery eyes with foreign body gritty sensation without evidence of foreign body, no corneal defects, may be related to conjunctivitis.  She does have a lacrimal duct that appears slightly enlarged but not significantly different on the right eye comparatively.  I advised warm compresses and will give antibiotic eyedrops and she can follow-up in the eye clinic in the morning.       FINAL CLINICAL IMPRESSION(S) / ED DIAGNOSES   Final diagnoses:  Irritation of left eye     Rx / DC Orders   ED Discharge Orders          Ordered    trimethoprim -polymyxin b  (POLYTRIM ) ophthalmic solution  Every 6 hours        01/03/24 0153             Note:  This document was prepared using Dragon voice recognition software and may include unintentional dictation errors.    Cyrena Mylar, MD 01/03/24 [phone]

## 2024-01-03 NOTE — Discharge Instructions (Signed)
 The irritation in your eyes may be due to a eye infection or a blockage of the glands around your eye.  Use warm compresses throughout the day to help unclog ducts and use the eyedrops to help prevent/treat eye infection.  Call your eye doctor at [facility] [facility] in the morning for a follow-up appointment.  If you do develop pain or irritation to the area you can use ibuprofen 400 mg every 6 hours as needed for pain.  Thank you for choosing us  for your health care today!  Please see your primary doctor this week for a follow up appointment.   If you have any new, worsening, or unexpected symptoms call your doctor right away or come back to the emergency department for reevaluation.  It was my pleasure to care for you today.   Ginnie EDISON Cyrena, MD
